# Patient Record
Sex: Female | Born: 1965
Health system: Southern US, Community
[De-identification: ages and names within clinical notes are randomized; demographics above are authoritative.]

## PROBLEM LIST (undated history)

## (undated) VITALS — BP 141/81 | HR 79 | Temp 98.9°F | Resp 18

## (undated) DIAGNOSIS — F319 Bipolar disorder, unspecified: Secondary | ICD-10-CM

## (undated) DIAGNOSIS — F259 Schizoaffective disorder, unspecified: Secondary | ICD-10-CM

## (undated) DIAGNOSIS — E079 Disorder of thyroid, unspecified: Secondary | ICD-10-CM

## (undated) HISTORY — PX: CHOLECYSTECTOMY: SHX55

---

## 2019-03-17 ENCOUNTER — Emergency Department (HOSPITAL_COMMUNITY): Payer: Medicare Other

## 2019-03-17 ENCOUNTER — Encounter (HOSPITAL_COMMUNITY): Payer: Self-pay | Admitting: Emergency Medicine

## 2019-03-17 ENCOUNTER — Other Ambulatory Visit: Payer: Self-pay

## 2019-03-17 ENCOUNTER — Inpatient Hospital Stay (HOSPITAL_COMMUNITY)
Admission: EM | Admit: 2019-03-17 | Discharge: 2019-03-22 | DRG: 392 | Disposition: A | Discharge status: Nursing Home (Includes ICF) | Payer: Medicare Other | Source: Skilled Nursing Facility | Attending: Internal Medicine | Admitting: Internal Medicine

## 2019-03-17 ENCOUNTER — Ambulatory Visit (HOSPITAL_COMMUNITY)
Admission: EM | Admit: 2019-03-17 | Discharge: 2019-03-17 | Disposition: A | Discharge status: Home / Self Care | Payer: No Typology Code available for payment source | Source: Ambulatory Visit | Attending: Emergency Medicine | Admitting: Emergency Medicine

## 2019-03-17 DIAGNOSIS — R519 Headache, unspecified: Secondary | ICD-10-CM

## 2019-03-17 DIAGNOSIS — F259 Schizoaffective disorder, unspecified: Secondary | ICD-10-CM | POA: Diagnosis present

## 2019-03-17 DIAGNOSIS — F17210 Nicotine dependence, cigarettes, uncomplicated: Secondary | ICD-10-CM | POA: Diagnosis present

## 2019-03-17 DIAGNOSIS — Z720 Tobacco use: Secondary | ICD-10-CM

## 2019-03-17 DIAGNOSIS — A084 Viral intestinal infection, unspecified: Secondary | ICD-10-CM | POA: Diagnosis not present

## 2019-03-17 DIAGNOSIS — D3502 Benign neoplasm of left adrenal gland: Secondary | ICD-10-CM | POA: Diagnosis present

## 2019-03-17 DIAGNOSIS — Z23 Encounter for immunization: Secondary | ICD-10-CM

## 2019-03-17 DIAGNOSIS — Z590 Homelessness: Secondary | ICD-10-CM

## 2019-03-17 DIAGNOSIS — Z20828 Contact with and (suspected) exposure to other viral communicable diseases: Secondary | ICD-10-CM | POA: Diagnosis present

## 2019-03-17 DIAGNOSIS — R112 Nausea with vomiting, unspecified: Secondary | ICD-10-CM

## 2019-03-17 DIAGNOSIS — Z0441 Encounter for examination and observation following alleged adult rape: Secondary | ICD-10-CM | POA: Diagnosis present

## 2019-03-17 DIAGNOSIS — T7421XA Adult sexual abuse, confirmed, initial encounter: Secondary | ICD-10-CM

## 2019-03-17 DIAGNOSIS — Z9049 Acquired absence of other specified parts of digestive tract: Secondary | ICD-10-CM

## 2019-03-17 DIAGNOSIS — R42 Dizziness and giddiness: Secondary | ICD-10-CM

## 2019-03-17 HISTORY — DX: Schizoaffective disorder, unspecified: F25.9

## 2019-03-17 LAB — COMPREHENSIVE METABOLIC PANEL
ALT: 16 U/L (ref 0–44)
AST: 24 U/L (ref 15–41)
Albumin: 4.4 g/dL (ref 3.5–5.0)
Alkaline Phosphatase: 111 U/L (ref 38–126)
Anion gap: 14 (ref 5–15)
BUN: 12 mg/dL (ref 6–20)
CO2: 24 mmol/L (ref 22–32)
Calcium: 9.5 mg/dL (ref 8.9–10.3)
Chloride: 102 mmol/L (ref 98–111)
Creatinine, Ser: 0.63 mg/dL (ref 0.44–1.00)
GFR calc Af Amer: >60 mL/min (ref >60)
GFR calc non Af Amer: >60 mL/min (ref >60)
Glucose, Bld: 110 mg/dL — ABNORMAL HIGH (ref 70–99)
Potassium: 4.4 mmol/L (ref 3.5–5.1)
Sodium: 140 mmol/L (ref 135–145)
Total Bilirubin: 1.1 mg/dL (ref 0.3–1.2)
Total Protein: 7.9 g/dL (ref 6.5–8.1)

## 2019-03-17 LAB — CBC WITH DIFFERENTIAL/PLATELET
Abs Immature Granulocytes: 0.03 10*3/uL (ref 0.00–0.07)
Basophils Absolute: 0.1 10*3/uL (ref 0.0–0.1)
Basophils Relative: 1 %
Eosinophils Absolute: 0.1 10*3/uL (ref 0.0–0.5)
Eosinophils Relative: 1 %
HCT: 50.3 % — ABNORMAL HIGH (ref 36.0–46.0)
Hemoglobin: 16.2 g/dL — ABNORMAL HIGH (ref 12.0–15.0)
Immature Granulocytes: 0 %
Lymphocytes Relative: 18 %
Lymphs Abs: 2.2 10*3/uL (ref 0.7–4.0)
MCH: 28.3 pg (ref 26.0–34.0)
MCHC: 32.2 g/dL (ref 30.0–36.0)
MCV: 87.8 fL (ref 80.0–100.0)
Monocytes Absolute: 0.6 10*3/uL (ref 0.1–1.0)
Monocytes Relative: 5 %
Neutro Abs: 9 10*3/uL — ABNORMAL HIGH (ref 1.7–7.7)
Neutrophils Relative %: 75 %
Platelets: 232 10*3/uL (ref 150–400)
RBC: 5.73 MIL/uL — ABNORMAL HIGH (ref 3.87–5.11)
RDW: 12.7 % (ref 11.5–15.5)
WBC: 12 10*3/uL — ABNORMAL HIGH (ref 4.0–10.5)
nRBC: 0 % (ref 0.0–0.2)

## 2019-03-17 LAB — I-STAT BETA HCG BLOOD, ED (MC, WL, AP ONLY): I-stat hCG, quantitative: <5 m[IU]/mL (ref <5)

## 2019-03-17 LAB — LIPASE, BLOOD: Lipase: 25 U/L (ref 11–51)

## 2019-03-17 MED ORDER — ONDANSETRON HCL 4 MG/2ML IJ SOLN
4.0000 mg | Freq: Once | INTRAMUSCULAR | Status: AC
  Administered 2019-03-17: 4 mg via INTRAVENOUS
  Filled 2019-03-17: qty 2

## 2019-03-17 MED ORDER — CEFTRIAXONE SODIUM 250 MG IJ SOLR
250.0000 mg | Freq: Once | INTRAMUSCULAR | Status: AC
  Administered 2019-03-17: 250 mg via INTRAMUSCULAR
  Filled 2019-03-17: qty 250

## 2019-03-17 MED ORDER — LIDOCAINE HCL (PF) 1 % IJ SOLN
0.9000 mL | Freq: Once | INTRAMUSCULAR | Status: AC
  Administered 2019-03-17: 0.9 mL
  Filled 2019-03-17: qty 30

## 2019-03-17 MED ORDER — NICOTINE 14 MG/24HR TD PT24
14.0000 mg | MEDICATED_PATCH | Freq: Every day | TRANSDERMAL | Status: DC
  Administered 2019-03-18 – 2019-03-22 (×5): 14 mg via TRANSDERMAL
  Filled 2019-03-17 (×5): qty 1

## 2019-03-17 MED ORDER — ACETAMINOPHEN 325 MG PO TABS
650.0000 mg | ORAL_TABLET | Freq: Once | ORAL | Status: AC
  Administered 2019-03-17: 650 mg via ORAL
  Filled 2019-03-17: qty 2

## 2019-03-17 MED ORDER — ENOXAPARIN SODIUM 40 MG/0.4ML ~~LOC~~ SOLN
40.0000 mg | Freq: Every day | SUBCUTANEOUS | Status: DC
  Administered 2019-03-18 – 2019-03-22 (×5): 40 mg via SUBCUTANEOUS
  Filled 2019-03-17 (×5): qty 0.4

## 2019-03-17 MED ORDER — IOHEXOL 300 MG/ML  SOLN
100.0000 mL | Freq: Once | INTRAMUSCULAR | Status: AC | PRN
  Administered 2019-03-17: 100 mL via INTRAVENOUS

## 2019-03-17 MED ORDER — SODIUM CHLORIDE 0.9 % IV BOLUS
1000.0000 mL | Freq: Once | INTRAVENOUS | Status: AC
  Administered 2019-03-17: 1000 mL via INTRAVENOUS

## 2019-03-17 MED ORDER — STERILE WATER FOR INJECTION IJ SOLN
INTRAMUSCULAR | Status: AC
  Filled 2019-03-17: qty 10

## 2019-03-17 MED ORDER — ACETAMINOPHEN 650 MG RE SUPP: 650.0000 mg | Freq: Four times a day (QID) | RECTAL | Status: DC | PRN

## 2019-03-17 MED ORDER — METOCLOPRAMIDE HCL 5 MG/ML IJ SOLN
10.0000 mg | Freq: Once | INTRAMUSCULAR | Status: AC
  Administered 2019-03-17: 10 mg via INTRAVENOUS
  Filled 2019-03-17: qty 2

## 2019-03-17 MED ORDER — IOHEXOL 350 MG/ML SOLN
80.0000 mL | Freq: Once | INTRAVENOUS | Status: AC | PRN
  Administered 2019-03-17: 80 mL via INTRAVENOUS

## 2019-03-17 MED ORDER — METRONIDAZOLE 500 MG PO TABS
2000.0000 mg | ORAL_TABLET | Freq: Once | ORAL | Status: AC
  Administered 2019-03-17: 2000 mg via ORAL
  Filled 2019-03-17: qty 4

## 2019-03-17 MED ORDER — SODIUM CHLORIDE (PF) 0.9 % IJ SOLN
INTRAMUSCULAR | Status: AC
  Administered 2019-03-17: 10 mL
  Filled 2019-03-17: qty 50

## 2019-03-17 MED ORDER — SODIUM CHLORIDE 0.9 % IV SOLN
INTRAVENOUS | Status: AC
  Administered 2019-03-18 (×2): via INTRAVENOUS

## 2019-03-17 MED ORDER — AZITHROMYCIN 250 MG PO TABS
1000.0000 mg | ORAL_TABLET | Freq: Once | ORAL | Status: AC
  Administered 2019-03-17: 1000 mg via ORAL
  Filled 2019-03-17: qty 4

## 2019-03-17 MED ORDER — ONDANSETRON HCL 4 MG/2ML IJ SOLN
4.0000 mg | Freq: Four times a day (QID) | INTRAMUSCULAR | Status: DC | PRN
  Administered 2019-03-18 (×2): 4 mg via INTRAVENOUS
  Filled 2019-03-17 (×2): qty 2

## 2019-03-17 MED ORDER — ACETAMINOPHEN 325 MG PO TABS
650.0000 mg | ORAL_TABLET | Freq: Four times a day (QID) | ORAL | Status: DC | PRN
  Administered 2019-03-18 – 2019-03-22 (×2): 650 mg via ORAL
  Filled 2019-03-17 (×2): qty 2
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
## 2019-03-17 NOTE — ED Notes (Signed)
Sane Nurse Called in.
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
## 2019-03-18 ENCOUNTER — Encounter (HOSPITAL_COMMUNITY): Payer: Self-pay | Admitting: Internal Medicine

## 2019-03-18 DIAGNOSIS — A084 Viral intestinal infection, unspecified: Secondary | ICD-10-CM | POA: Diagnosis present

## 2019-03-18 DIAGNOSIS — Z720 Tobacco use: Secondary | ICD-10-CM

## 2019-03-18 DIAGNOSIS — R519 Headache, unspecified: Secondary | ICD-10-CM

## 2019-03-18 DIAGNOSIS — D3502 Benign neoplasm of left adrenal gland: Secondary | ICD-10-CM | POA: Diagnosis present

## 2019-03-18 DIAGNOSIS — Z20828 Contact with and (suspected) exposure to other viral communicable diseases: Secondary | ICD-10-CM | POA: Diagnosis present

## 2019-03-18 DIAGNOSIS — R112 Nausea with vomiting, unspecified: Secondary | ICD-10-CM | POA: Diagnosis present

## 2019-03-18 DIAGNOSIS — R42 Dizziness and giddiness: Secondary | ICD-10-CM

## 2019-03-18 DIAGNOSIS — F17210 Nicotine dependence, cigarettes, uncomplicated: Secondary | ICD-10-CM | POA: Diagnosis present

## 2019-03-18 DIAGNOSIS — Z23 Encounter for immunization: Secondary | ICD-10-CM | POA: Diagnosis present

## 2019-03-18 DIAGNOSIS — Z9049 Acquired absence of other specified parts of digestive tract: Secondary | ICD-10-CM | POA: Diagnosis not present

## 2019-03-18 DIAGNOSIS — Z590 Homelessness: Secondary | ICD-10-CM | POA: Diagnosis not present

## 2019-03-18 DIAGNOSIS — T7421XA Adult sexual abuse, confirmed, initial encounter: Secondary | ICD-10-CM

## 2019-03-18 DIAGNOSIS — F259 Schizoaffective disorder, unspecified: Secondary | ICD-10-CM | POA: Diagnosis present

## 2019-03-18 LAB — WET PREP, GENITAL
Clue Cells Wet Prep HPF POC: NONE SEEN
Sperm: NONE SEEN
Trich, Wet Prep: NONE SEEN
Yeast Wet Prep HPF POC: NONE SEEN

## 2019-03-18 LAB — CBC
HCT: 43.7 % (ref 36.0–46.0)
Hemoglobin: 13.8 g/dL (ref 12.0–15.0)
MCH: 28.7 pg (ref 26.0–34.0)
MCHC: 31.6 g/dL (ref 30.0–36.0)
MCV: 90.9 fL (ref 80.0–100.0)
Platelets: 205 10*3/uL (ref 150–400)
RBC: 4.81 MIL/uL (ref 3.87–5.11)
RDW: 12.8 % (ref 11.5–15.5)
WBC: 11.7 10*3/uL — ABNORMAL HIGH (ref 4.0–10.5)
nRBC: 0 % (ref 0.0–0.2)

## 2019-03-18 LAB — SARS CORONAVIRUS 2 (TAT 6-24 HRS): SARS Coronavirus 2: NEGATIVE

## 2019-03-18 LAB — URINALYSIS, ROUTINE W REFLEX MICROSCOPIC
Bilirubin Urine: NEGATIVE
Glucose, UA: NEGATIVE mg/dL
Hgb urine dipstick: NEGATIVE
Ketones, ur: 80 mg/dL — AB
Leukocytes,Ua: NEGATIVE
Nitrite: NEGATIVE
Protein, ur: NEGATIVE mg/dL
Specific Gravity, Urine: >1.046 — ABNORMAL HIGH (ref 1.005–1.030)
pH: 5 (ref 5.0–8.0)

## 2019-03-18 LAB — RAPID URINE DRUG SCREEN, HOSP PERFORMED
Amphetamines: NOT DETECTED
Barbiturates: NOT DETECTED
Benzodiazepines: NOT DETECTED
Cocaine: NOT DETECTED
Opiates: NOT DETECTED
Tetrahydrocannabinol: NOT DETECTED

## 2019-03-18 LAB — HIV ANTIBODY (ROUTINE TESTING W REFLEX): HIV Screen 4th Generation wRfx: NONREACTIVE

## 2019-03-18 NOTE — ED Notes (Signed)
Pt said she is dizzy and would like medication to stop dizziness. Messaged provider
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
## 2019-03-18 NOTE — ED Notes (Addendum)
Pt drank 32 oz. water
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
## 2019-03-19 MED ORDER — PNEUMOCOCCAL VAC POLYVALENT 25 MCG/0.5ML IJ INJ
0.5000 mL | INJECTION | INTRAMUSCULAR | Status: AC
  Administered 2019-03-20: 0.5 mL via INTRAMUSCULAR
  Filled 2019-03-19: qty 0.5

## 2019-03-19 NOTE — Progress Notes (Signed)
24hr urine collection started 03/19/2019 @ 0500. Norlene Duel RN, BSN

## 2019-03-19 NOTE — Progress Notes (Signed)
Patient took 24 hour urine off of the ice in bathroom and brought it to the desk counter and left it. Unsure of how long it was left off of ice. CNA and RN unable to accurately collect for 24 hour urine.  Donne Hazel, RN
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
## 2019-03-19 NOTE — Progress Notes (Signed)
Patient spits in her urine hat post void making collection of 24 hour urine difficult. She states "I have urine coming through my gums". Dr Marthenia Rolling paged to make him aware. Donne Hazel, RN

## 2019-03-20 ENCOUNTER — Other Ambulatory Visit: Payer: Self-pay

## 2019-03-20 LAB — DHEA-SULFATE: DHEA-SO4: 138 ug/dL (ref 41.2–243.7)

## 2019-03-20 MED ORDER — CLONAZEPAM 0.5 MG PO TABS
0.5000 mg | ORAL_TABLET | Freq: Two times a day (BID) | ORAL | Status: DC
  Administered 2019-03-20 – 2019-03-22 (×4): 0.5 mg via ORAL
  Filled 2019-03-20 (×5): qty 1

## 2019-03-20 MED ORDER — HALOPERIDOL 2 MG PO TABS
2.0000 mg | ORAL_TABLET | Freq: Two times a day (BID) | ORAL | Status: DC
  Administered 2019-03-20 – 2019-03-22 (×4): 2 mg via ORAL
  Filled 2019-03-20 (×6): qty 1

## 2019-03-20 MED ORDER — LAMOTRIGINE 100 MG PO TABS
100.0000 mg | ORAL_TABLET | Freq: Every day | ORAL | Status: DC
  Administered 2019-03-20 – 2019-03-22 (×3): 100 mg via ORAL
  Filled 2019-03-20 (×3): qty 1

## 2019-03-20 MED ORDER — HYDROXYZINE HCL 25 MG PO TABS: 25.0000 mg | ORAL_TABLET | Freq: Four times a day (QID) | ORAL | Status: DC | PRN
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
## 2019-03-21 LAB — SARS CORONAVIRUS 2 (TAT 6-24 HRS): SARS Coronavirus 2: NEGATIVE

## 2019-03-21 MED ORDER — SIMETHICONE 80 MG PO CHEW: 80.0000 mg | CHEWABLE_TABLET | Freq: Four times a day (QID) | ORAL | Status: DC | PRN

## 2019-03-21 MED ORDER — HALOPERIDOL 2 MG PO TABS: 2.0000 mg | ORAL_TABLET | Freq: Two times a day (BID) | ORAL | 0 refills | Status: DC

## 2019-03-21 MED ORDER — INFLUENZA VAC SPLIT QUAD 0.5 ML IM SUSY
0.5000 mL | PREFILLED_SYRINGE | INTRAMUSCULAR | Status: AC
  Administered 2019-03-22: 0.5 mL via INTRAMUSCULAR
  Filled 2019-03-21: qty 0.5

## 2019-03-21 MED ORDER — LAMOTRIGINE 100 MG PO TABS: 100.0000 mg | ORAL_TABLET | Freq: Every day | ORAL | 1 refills | Status: DC

## 2019-03-21 NOTE — TOC Progression Note (Signed)
Transition of Care [facility]) - Progression Note    Patient Details  Name: Martha Peters MRN: [id number] Date of Birth: July 07, 1965  Transition of Care [facility]) CM/SW Contact  Leeroy Cha, RN Phone Number: 03/21/2019, 12:15 PM  Clinical Narrative:    fl2 faxed to [facility]/will have to have new covid test-Dr. Karleen Hampshire made aware of this.   Expected Discharge Plan: Assisted Living Barriers to Discharge: Continued Medical Work up  Expected Discharge Plan and Services Expected Discharge Plan: Assisted Living   Discharge Planning Services: CM Consult Post Acute Care Choice: Resumption of Svcs/PTA Provider Living arrangements for the past 2 months: Assisted Living Facility                 DME Arranged: N/A DME Agency: NA       HH Arranged: NA HH Agency: NA         Social Determinants of Health (SDOH) Interventions    Readmission Risk Interventions No flowsheet data found.
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
## 2019-03-21 NOTE — Care Management Important Message (Signed)
Important Message  Patient Details IM Letter given to Velva Harman RN to present to the Patient Name: Martha Peters MRN: [id number] Date of Birth: May 12, 1966   Medicare Important Message Given:  Yes     Kerin Salen 03/21/2019, 12:38 PM
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
## 2019-03-22 NOTE — Progress Notes (Signed)
Patient is ready for discharge but unsure of when ALF is coming to pick her up. Also, patient needs a police escort to ALF. Call made to [place], [place] for assistance. Patient updated. Donne Hazel, RN

## 2019-03-22 NOTE — Progress Notes (Signed)
Call to Legal Guardian to make her aware that patient is retuning to ALF, [facility]. She verbalized understanding. Donne Hazel, RN

## 2019-03-22 NOTE — Progress Notes (Addendum)
Spoke with Roderic Palau to make him aware that patient needs police escort back to ALF per ED MD note. He will attempt to get one for her. Donne Hazel, RN
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
## 2019-05-26 ENCOUNTER — Emergency Department (HOSPITAL_COMMUNITY)
Admission: EM | Admit: 2019-05-26 | Discharge: 2019-05-26 | Disposition: A | Discharge status: Home / Self Care | Payer: Medicare Other | Attending: Emergency Medicine | Admitting: Emergency Medicine

## 2019-05-26 DIAGNOSIS — E876 Hypokalemia: Secondary | ICD-10-CM | POA: Insufficient documentation

## 2019-05-26 DIAGNOSIS — F17210 Nicotine dependence, cigarettes, uncomplicated: Secondary | ICD-10-CM | POA: Insufficient documentation

## 2019-05-26 DIAGNOSIS — Z79899 Other long term (current) drug therapy: Secondary | ICD-10-CM | POA: Insufficient documentation

## 2019-05-26 DIAGNOSIS — R451 Restlessness and agitation: Secondary | ICD-10-CM | POA: Diagnosis present

## 2019-05-26 LAB — COMPREHENSIVE METABOLIC PANEL
ALT: 17 U/L (ref 0–44)
AST: 24 U/L (ref 15–41)
Albumin: 4.9 g/dL (ref 3.5–5.0)
Alkaline Phosphatase: 99 U/L (ref 38–126)
Anion gap: 16 — ABNORMAL HIGH (ref 5–15)
BUN: 27 mg/dL — ABNORMAL HIGH (ref 6–20)
CO2: 27 mmol/L (ref 22–32)
Calcium: 9.9 mg/dL (ref 8.9–10.3)
Chloride: 96 mmol/L — ABNORMAL LOW (ref 98–111)
Creatinine, Ser: 0.67 mg/dL (ref 0.44–1.00)
GFR calc Af Amer: >60 mL/min (ref >60)
GFR calc non Af Amer: >60 mL/min (ref >60)
Glucose, Bld: 97 mg/dL (ref 70–99)
Potassium: 3.1 mmol/L — ABNORMAL LOW (ref 3.5–5.1)
Sodium: 139 mmol/L (ref 135–145)
Total Bilirubin: 1.5 mg/dL — ABNORMAL HIGH (ref 0.3–1.2)
Total Protein: 8.3 g/dL — ABNORMAL HIGH (ref 6.5–8.1)

## 2019-05-26 LAB — CBC
HCT: 48.6 % — ABNORMAL HIGH (ref 36.0–46.0)
Hemoglobin: 15.8 g/dL — ABNORMAL HIGH (ref 12.0–15.0)
MCH: 28.3 pg (ref 26.0–34.0)
MCHC: 32.5 g/dL (ref 30.0–36.0)
MCV: 86.9 fL (ref 80.0–100.0)
Platelets: 297 10*3/uL (ref 150–400)
RBC: 5.59 MIL/uL — ABNORMAL HIGH (ref 3.87–5.11)
RDW: 13.3 % (ref 11.5–15.5)
WBC: 13 10*3/uL — ABNORMAL HIGH (ref 4.0–10.5)
nRBC: 0 % (ref 0.0–0.2)

## 2019-05-26 LAB — RAPID URINE DRUG SCREEN, HOSP PERFORMED
Amphetamines: NOT DETECTED
Barbiturates: NOT DETECTED
Benzodiazepines: POSITIVE — AB
Cocaine: NOT DETECTED
Opiates: NOT DETECTED
Tetrahydrocannabinol: NOT DETECTED

## 2019-05-26 LAB — SALICYLATE LEVEL: Salicylate Lvl: <7 mg/dL — ABNORMAL LOW (ref 7.0–30.0)

## 2019-05-26 LAB — ACETAMINOPHEN LEVEL: Acetaminophen (Tylenol), Serum: <10 ug/mL — ABNORMAL LOW (ref 10–30)

## 2019-05-26 LAB — ETHANOL: Alcohol, Ethyl (B): <10 mg/dL (ref <10)

## 2019-05-26 MED ORDER — POTASSIUM CHLORIDE CRYS ER 20 MEQ PO TBCR
40.0000 meq | EXTENDED_RELEASE_TABLET | Freq: Once | ORAL | Status: AC
  Administered 2019-05-26: 14:00:00 40 meq via ORAL
  Filled 2019-05-26: qty 2

## 2019-05-26 MED ORDER — LORAZEPAM 1 MG PO TABS
1.0000 mg | ORAL_TABLET | Freq: Once | ORAL | Status: AC
  Administered 2019-05-26: 17:00:00 1 mg via ORAL
  Filled 2019-05-26: qty 1

## 2019-05-26 NOTE — ED Triage Notes (Signed)
Pt brought to [facility] by EMS. VS .BP 138/ 70 ,HR 106, O2 97% , T 96 .  Pt cooperative with EMS. Pt from facility, [place] [facility]. Pt was combative at facility. Pt resting in bed.

## 2019-05-26 NOTE — Progress Notes (Addendum)
CSW met with pt who viewed the following complaints about her facility:  1. Water is oily. 2.  Pt would like pepsi, facility is not offering that or tea to the pt. 3. The food at the facility, "is infected".  Pt presented as responding to external stimuli and presented with pressure speech, eventually launching into a long rambling, difficult to followdiscourse about a time when the facility gave her medications and ended it with, "my brains are falling out".  Pt's stated thoughts are presenting as disconnected at times.  Pt at no time stated she was in danger.  Pt stated she came to the hospital because her facility wanted her to,"get checked out" and stated that the pt is willing to go back if CSW contacts [facility].  CSW will continue to follow for D/C needs.  Alphonse Guild. Johnel Yielding, LCSW, LCAS, CSI Transitions of Care Clinical Social Worker Care Coordination Department Ph: [phone]

## 2019-05-26 NOTE — Progress Notes (Signed)
CSW received a call from staff at [facility] stating the patient has been offered a bed and has been accepted for return and that the pt can arrive on ASAP on 1/10.  The room number will [id number].  The number for report is [phone].  CSW will update RN and EDP.  Alphonse Guild. Crystal Scarberry, Latanya Presser, LCAS Clinical Social Worker Ph: [phone]

## 2019-05-26 NOTE — Progress Notes (Signed)
CSW called pt's facility at:  [place] [address]  CSW called the facility four times and called all the names in the directory but could not speak to anyone in person, nor could the CSW get the facility's on-call supervisor by dialing 0.  CSW called [employer] to request a welfare check to request the facility call the CSW back.  [employer] to call the facility  CSW will continue to follow for D/C needs.  Alphonse Guild. Shatina Streets, LCSW, LCAS, CSI Transitions of Care Clinical Social Worker Care Coordination Department Ph: [phone]

## 2019-05-26 NOTE — Progress Notes (Signed)
CSW received a call from [facility] at the facility that states that pt is safe to return by taxi.  CSW to provide.  CSW will continue to follow for D/C needs.  Martha Peters. Margerie Fraiser, LCSW, LCAS, CSI Transitions of Care Clinical Social Worker Care Coordination Department Ph: [phone]

## 2019-05-26 NOTE — Progress Notes (Signed)
Per [employer], pt is unhappy with the pt's group home and would like to speak to social work.  CSW will continue to follow for D/C needs.  Alphonse Guild. Vergil Burby, LCSW, LCAS, CSI Transitions of Care Clinical Social Worker Care Coordination Department Ph: [phone]

## 2019-05-26 NOTE — Progress Notes (Signed)
05/26/2019  1114  Labs drawn. Dark green, light green, gold, red, and purple tubes sent to main lab. Urine sent to main lab.
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
## 2019-05-26 NOTE — ED Notes (Signed)
DCd off unit to [employer] per provider. Pt cooperative, no s/s of distress. Pt transported by [facility]

## 2019-06-17 ENCOUNTER — Other Ambulatory Visit: Payer: Self-pay

## 2019-06-17 ENCOUNTER — Encounter (HOSPITAL_COMMUNITY): Payer: Self-pay | Admitting: Registered Nurse

## 2019-06-17 ENCOUNTER — Encounter (HOSPITAL_COMMUNITY): Payer: Self-pay | Admitting: Psychiatry

## 2019-06-17 ENCOUNTER — Inpatient Hospital Stay (HOSPITAL_COMMUNITY)
Admission: RE | Admit: 2019-06-17 | Discharge: 2019-06-25 | DRG: 885 | Disposition: A | Discharge status: Home / Self Care | Payer: Medicare HMO | Attending: Psychiatry | Admitting: Psychiatry

## 2019-06-17 ENCOUNTER — Observation Stay (HOSPITAL_COMMUNITY)
Admission: RE | Admit: 2019-06-17 | Discharge: 2019-06-17 | Disposition: A | Discharge status: Rehabilitation Facility | Payer: Medicare HMO | Attending: Emergency Medicine | Admitting: Emergency Medicine

## 2019-06-17 DIAGNOSIS — R569 Unspecified convulsions: Secondary | ICD-10-CM | POA: Diagnosis present

## 2019-06-17 DIAGNOSIS — E039 Hypothyroidism, unspecified: Secondary | ICD-10-CM | POA: Diagnosis present

## 2019-06-17 DIAGNOSIS — Z91128 Patient's intentional underdosing of medication regimen for other reason: Secondary | ICD-10-CM | POA: Insufficient documentation

## 2019-06-17 DIAGNOSIS — Z79899 Other long term (current) drug therapy: Secondary | ICD-10-CM

## 2019-06-17 DIAGNOSIS — F250 Schizoaffective disorder, bipolar type: Secondary | ICD-10-CM | POA: Diagnosis present

## 2019-06-17 DIAGNOSIS — Z886 Allergy status to analgesic agent status: Secondary | ICD-10-CM | POA: Insufficient documentation

## 2019-06-17 DIAGNOSIS — Z91018 Allergy to other foods: Secondary | ICD-10-CM

## 2019-06-17 DIAGNOSIS — Z008 Encounter for other general examination: Principal | ICD-10-CM | POA: Insufficient documentation

## 2019-06-17 DIAGNOSIS — Z20822 Contact with and (suspected) exposure to covid-19: Secondary | ICD-10-CM | POA: Insufficient documentation

## 2019-06-17 DIAGNOSIS — D529 Folate deficiency anemia, unspecified: Secondary | ICD-10-CM | POA: Diagnosis present

## 2019-06-17 DIAGNOSIS — F17210 Nicotine dependence, cigarettes, uncomplicated: Secondary | ICD-10-CM | POA: Insufficient documentation

## 2019-06-17 DIAGNOSIS — Z03818 Encounter for observation for suspected exposure to other biological agents ruled out: Secondary | ICD-10-CM | POA: Diagnosis not present

## 2019-06-17 DIAGNOSIS — Z9114 Patient's other noncompliance with medication regimen: Secondary | ICD-10-CM

## 2019-06-17 DIAGNOSIS — E559 Vitamin D deficiency, unspecified: Secondary | ICD-10-CM | POA: Diagnosis present

## 2019-06-17 DIAGNOSIS — G4700 Insomnia, unspecified: Secondary | ICD-10-CM | POA: Diagnosis present

## 2019-06-17 DIAGNOSIS — R69 Illness, unspecified: Secondary | ICD-10-CM | POA: Diagnosis not present

## 2019-06-17 DIAGNOSIS — Z91410 Personal history of adult physical and sexual abuse: Secondary | ICD-10-CM | POA: Diagnosis not present

## 2019-06-17 DIAGNOSIS — F4010 Social phobia, unspecified: Secondary | ICD-10-CM | POA: Diagnosis present

## 2019-06-17 DIAGNOSIS — F332 Major depressive disorder, recurrent severe without psychotic features: Secondary | ICD-10-CM | POA: Insufficient documentation

## 2019-06-17 DIAGNOSIS — G3184 Mild cognitive impairment, so stated: Secondary | ICD-10-CM | POA: Diagnosis present

## 2019-06-17 DIAGNOSIS — Z9049 Acquired absence of other specified parts of digestive tract: Secondary | ICD-10-CM

## 2019-06-17 DIAGNOSIS — Z833 Family history of diabetes mellitus: Secondary | ICD-10-CM

## 2019-06-17 DIAGNOSIS — Z888 Allergy status to other drugs, medicaments and biological substances status: Secondary | ICD-10-CM

## 2019-06-17 DIAGNOSIS — Z818 Family history of other mental and behavioral disorders: Secondary | ICD-10-CM

## 2019-06-17 LAB — CBC
HCT: 44.5 % (ref 36.0–46.0)
Hemoglobin: 14.4 g/dL (ref 12.0–15.0)
MCH: 28.8 pg (ref 26.0–34.0)
MCHC: 32.4 g/dL (ref 30.0–36.0)
MCV: 89 fL (ref 80.0–100.0)
Platelets: 205 10*3/uL (ref 150–400)
RBC: 5 MIL/uL (ref 3.87–5.11)
RDW: 13.2 % (ref 11.5–15.5)
WBC: 7.3 10*3/uL (ref 4.0–10.5)
nRBC: 0 % (ref 0.0–0.2)

## 2019-06-17 LAB — COMPREHENSIVE METABOLIC PANEL
ALT: 12 U/L (ref 0–44)
AST: 15 U/L (ref 15–41)
Albumin: 3.7 g/dL (ref 3.5–5.0)
Alkaline Phosphatase: 83 U/L (ref 38–126)
Anion gap: 8 (ref 5–15)
BUN: 13 mg/dL (ref 6–20)
CO2: 26 mmol/L (ref 22–32)
Calcium: 9.3 mg/dL (ref 8.9–10.3)
Chloride: 107 mmol/L (ref 98–111)
Creatinine, Ser: 0.49 mg/dL (ref 0.44–1.00)
GFR calc Af Amer: >60 mL/min (ref >60)
GFR calc non Af Amer: >60 mL/min (ref >60)
Glucose, Bld: 133 mg/dL — ABNORMAL HIGH (ref 70–99)
Potassium: 3.9 mmol/L (ref 3.5–5.1)
Sodium: 141 mmol/L (ref 135–145)
Total Bilirubin: 0.4 mg/dL (ref 0.3–1.2)
Total Protein: 6.8 g/dL (ref 6.5–8.1)

## 2019-06-17 LAB — RAPID URINE DRUG SCREEN, HOSP PERFORMED
Amphetamines: NOT DETECTED
Barbiturates: NOT DETECTED
Benzodiazepines: NOT DETECTED
Cocaine: NOT DETECTED
Opiates: NOT DETECTED
Tetrahydrocannabinol: NOT DETECTED

## 2019-06-17 LAB — RESPIRATORY PANEL BY RT PCR (FLU A&B, COVID)
Influenza A by PCR: NEGATIVE
Influenza B by PCR: NEGATIVE
SARS Coronavirus 2 by RT PCR: NEGATIVE

## 2019-06-17 LAB — ETHANOL: Alcohol, Ethyl (B): <10 mg/dL (ref <10)

## 2019-06-17 LAB — SALICYLATE LEVEL: Salicylate Lvl: <7 mg/dL — ABNORMAL LOW (ref 7.0–30.0)

## 2019-06-17 LAB — ACETAMINOPHEN LEVEL: Acetaminophen (Tylenol), Serum: <10 ug/mL — ABNORMAL LOW (ref 10–30)

## 2019-06-17 MED ORDER — CHOLECALCIFEROL 10 MCG (400 UNIT) PO TABS
400.0000 [IU] | ORAL_TABLET | Freq: Every day | ORAL | Status: DC
  Filled 2019-06-17 (×2): qty 1

## 2019-06-17 MED ORDER — MAGNESIUM HYDROXIDE 400 MG/5ML PO SUSP: 30.0000 mL | Freq: Every day | ORAL | Status: DC | PRN

## 2019-06-17 MED ORDER — VENLAFAXINE HCL ER 150 MG PO CP24
150.0000 mg | ORAL_CAPSULE | Freq: Every day | ORAL | Status: DC
  Administered 2019-06-19: 150 mg via ORAL
  Filled 2019-06-17 (×3): qty 1

## 2019-06-17 MED ORDER — FOLIC ACID 1 MG PO TABS
1.0000 mg | ORAL_TABLET | Freq: Every day | ORAL | Status: DC
  Administered 2019-06-19 – 2019-06-25 (×5): 1 mg via ORAL
  Filled 2019-06-17 (×9): qty 1

## 2019-06-17 MED ORDER — ALUM & MAG HYDROXIDE-SIMETH 200-200-20 MG/5ML PO SUSP
30.0000 mL | ORAL | Status: DC | PRN
  Administered 2019-06-20 – 2019-06-21 (×3): 30 mL via ORAL

## 2019-06-17 MED ORDER — CLONAZEPAM 0.5 MG PO TABS
0.5000 mg | ORAL_TABLET | Freq: Every day | ORAL | Status: DC
  Administered 2019-06-19 – 2019-06-20 (×2): 0.5 mg via ORAL
  Filled 2019-06-17 (×3): qty 1

## 2019-06-17 MED ORDER — ACETAMINOPHEN 325 MG PO TABS
650.0000 mg | ORAL_TABLET | Freq: Four times a day (QID) | ORAL | Status: DC | PRN
  Administered 2019-06-18 – 2019-06-24 (×4): 650 mg via ORAL
  Filled 2019-06-17 (×4): qty 2

## 2019-06-17 MED ORDER — VITAMIN B-12 100 MCG PO TABS
100.0000 ug | ORAL_TABLET | Freq: Every day | ORAL | Status: DC
  Administered 2019-06-19 – 2019-06-25 (×5): 100 ug via ORAL
  Filled 2019-06-17 (×13): qty 1

## 2019-06-17 MED ORDER — LIOTHYRONINE SODIUM 25 MCG PO TABS
25.0000 ug | ORAL_TABLET | Freq: Every day | ORAL | Status: DC
  Administered 2019-06-19 – 2019-06-25 (×5): 25 ug via ORAL
  Filled 2019-06-17 (×10): qty 1

## 2019-06-17 MED ORDER — CLONAZEPAM 1 MG PO TABS
1.0000 mg | ORAL_TABLET | Freq: Every day | ORAL | Status: DC
  Administered 2019-06-18 – 2019-06-20 (×3): 1 mg via ORAL
  Filled 2019-06-17 (×3): qty 1

## 2019-06-17 MED ORDER — LEVETIRACETAM 500 MG PO TABS
500.0000 mg | ORAL_TABLET | Freq: Two times a day (BID) | ORAL | Status: DC
  Administered 2019-06-19 – 2019-06-25 (×8): 500 mg via ORAL
  Filled 2019-06-17 (×19): qty 1

## 2019-06-17 NOTE — ED Triage Notes (Signed)
Patient arrived from [facility] for medical clearance.

## 2019-06-17 NOTE — H&P (Addendum)
Please see Observation Unit H&P completed by Earleen Newport, NP on 06/17/2019.
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
## 2019-06-17 NOTE — BH Assessment (Signed)
[facility] Assessment Progress Note  Case was staffed with Rankin NP who recommended patient be observed and monitored. Patient will be transported to [facility] for medical clearance. Charge nurse notified.
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
## 2019-06-17 NOTE — ED Notes (Addendum)
Report given to Maudie Mercury at [facility] and advised to wait for UDS to result then can be transferred to [facility] room 400-1.

## 2019-06-17 NOTE — H&P (Signed)
[place] [facility] Provider Admission PAA/H&P  Patient Identification: Martha Peters MRN:  [id number] Date of Evaluation:  06/17/2019 Chief Complaint:  mdd recurrent severe  Principal Diagnosis: Schizoaffective disorder, bipolar type ([place]) Diagnosis:  Principal Problem:   Schizoaffective disorder, bipolar type ([place])  History of Present Illness: Martha Peters, 54 y.o., female patient presented to [facility] as a walk in under IVC by staff member at assisted living facility.  IVC:  Patient not taking medications, agitation, aggressive behavior towards staff and other patient, not performing ADL'S.  seen face to face psych by this provider, consulted with Dr. Dwyane Dee; and chart reviewed on 06/17/19.  On evaluation Martha Peters reports that she doesn't know why she was brought to the hospital. Patient states that she has not been violent.  "I was only trying to get something to eat."  Patient denies suicidal/self-harm/homicidal ideation, psychosis, and paranoia.  Patient states that she does have a psychiatric history psychiatric hospitalization "A long time ago.  They diagnosed me with bipolar disorder."  Patient states that she is depressed and is not currently taking any medications for depression.   During evaluation Martha Peters is alert/oriented x 3; calm/cooperative; and mood depressed, with flat affect.  Patient does not appear to be responding to internal/external stimuli or delusional thoughts, but appears to be reluctant to answer some questions; may be related to thought blocking or patient not wanting to respond.  Patient denies suicidal/self-harm/homicidal ideation, psychosis, and paranoia.    Associated Signs/Symptoms: Depression Symptoms:  depressed mood, impaired memory, loss of energy/fatigue, (Hypo) Manic Symptoms:  Irritable Mood, Anxiety Symptoms:  Excessive Worry, Psychotic Symptoms:  denies PTSD Symptoms: Patient did not answer the question Total Time spent with patient:  30 minutes  Past Psychiatric History: Schizoaffective disorder bipolar type  Is the patient at risk to self? No.  Has the patient been a risk to self in the past 6 months? No.  Has the patient been a risk to self within the distant past? No.  Is the patient a risk to others? No.  Has the patient been a risk to others in the past 6 months? No.  Has the patient been a risk to others within the distant past? No.   Prior Inpatient Therapy: Prior Inpatient Therapy: No Prior Outpatient Therapy: Prior Outpatient Therapy: No Does patient have an ACCT team?: No Does patient have Intensive In-House Services?  : No Does patient have [facility] services? : No Does patient have P4CC services?: No  Alcohol Screening:   Substance Abuse History in the last 12 months:  Patient did not answer the question Consequences of Substance Abuse: NA Previous Psychotropic Medications: Yes  Psychological Evaluations: Yes  Past Medical History:  Past Medical History:  Diagnosis Date  . Schizoaffective disorder [facility])     Past Surgical History:  Procedure Laterality Date  . CHOLECYSTECTOMY     Family History:  Family History  Problem Relation Age of Onset  . Diabetes Maternal Grandmother    Family Psychiatric History: Unaware Tobacco Screening:   Social History:  Social History   Substance and Sexual Activity  Alcohol Use Never     Social History   Substance and Sexual Activity  Drug Use Never    Additional Social History: Marital status: Divorced    Pain Medications: See MAR Prescriptions: See MAR Over the Counter: See MAR History of alcohol / drug use?: No history of alcohol / drug abuse  Allergies:   Allergies  Allergen Reactions  . Celebrex [Celecoxib]   . Cinnamon    Lab Results: No results found for this or any previous visit (from the past 48 hour(s)).  Blood Alcohol level:  Lab Results  Component Value Date   ETH <10 0000000    Metabolic  Disorder Labs:  No results found for: HGBA1C, MPG No results found for: PROLACTIN No results found for: CHOL, TRIG, HDL, CHOLHDL, VLDL, LDLCALC  Current Medications: No current facility-administered medications for this encounter.   PTA Medications: Medications Prior to Admission  Medication Sig Dispense Refill Last Dose  . clonazePAM (KLONOPIN) 0.5 MG tablet Take 0.5 mg by mouth 2 (two) times daily.     . Cyanocobalamin (B-12) 100 MCG TABS Take 100 mcg by mouth daily.     . folic acid (FOLVITE) 1 MG tablet Take 1 mg by mouth daily.     . haloperidol (HALDOL) 2 MG tablet Take 1 tablet (2 mg total) by mouth 2 (two) times daily. 60 tablet 0   . hydrOXYzine (VISTARIL) 25 MG capsule Take 25 mg by mouth every 6 (six) hours as needed for anxiety.      . lamoTRIgine (LAMICTAL) 100 MG tablet Take 1 tablet (100 mg total) by mouth daily. 30 tablet 1   . levETIRAcetam (KEPPRA) 500 MG tablet Take 500 mg by mouth 2 (two) times daily.     Marland Kitchen liothyronine (CYTOMEL) 25 MCG tablet Take 25 mcg by mouth daily.     . Vitamin D, Cholecalciferol, 10 MCG (400 UNIT) TABS Take 400 Units by mouth daily.       Musculoskeletal: Strength & Muscle Tone: within normal limits Gait & Station: normal Patient leans: N/A  Psychiatric Specialty Exam: Physical Exam  Nursing note and vitals reviewed. Constitutional: She is oriented to person, place, and time. She appears well-developed and well-nourished.  Respiratory: Effort normal.  Musculoskeletal:        General: Normal range of motion.     Cervical back: Normal range of motion.  Neurological: She is alert and oriented to person, place, and time.  Skin: Skin is warm and dry.    Review of Systems  Psychiatric/Behavioral: Positive for agitation and behavioral problems. Hallucinations: Denies. Sleep disturbance: Denies. Suicidal ideas: Denies. Nervous/anxious: Denies.        Patient sent from assisted living related to increased agitation, violence towards staff  and other patients, history of schizoaffective disorder bipolar type and has not been taking her medications.   All other systems reviewed and are negative.   Blood pressure 114/73, pulse (!) 131, temperature 98.7 F (37.1 C), temperature source Oral, resp. rate 18, SpO2 98 %.There is no height or weight on file to calculate BMI.  General Appearance: Casual  Eye Contact:  Fair  Speech:  Clear and Coherent and Slow  Volume:  Decreased  Mood:  Depressed  Affect:  Depressed and Flat  Thought Process:  Coherent, Goal Directed, Linear and Descriptions of Associations: Intact  Orientation:  Full (Time, Place, and Person)  Thought Content:  WDL  Suicidal Thoughts:  No  Homicidal Thoughts:  No  Memory:  Immediate;   Fair Recent;   Fair  Judgement:  Fair  Insight:  Fair  Psychomotor Activity:  Decreased  Concentration:  Concentration: Fair and Attention Span: Fair  Recall:  [employer] of Knowledge:  Fair  Language:  Fair  Akathisia:  No  Handed:  Right  AIMS (if indicated):     Assets:  Desire for Improvement Housing Social Support  ADL's:  Intact  Cognition:  Impaired,  Mild  Sleep:       Treatment Plan Summary: Medication management and Plan Restart home medications  Observation Level/Precautions:  15 minute checks Laboratory:  CBC Chemistry Profile UDS UA  Patient sent to [facility] for medical clearance. Psychotherapy:  Individual Medications:  Restart home medications Consultations:  As needed Discharge Concerns:  Safety, stabilization, and access to medication Estimated LOS:  Overnight observation Other:     Patient has been accepted to [facility] OBS unit room 400/01 for overnight observation after medical clearance.      Deasia Chiu, NP 2/1/20216:26 PM

## 2019-06-17 NOTE — ED Notes (Signed)
[employer] here for transport to [facility].

## 2019-06-18 ENCOUNTER — Encounter (HOSPITAL_COMMUNITY): Payer: Self-pay | Admitting: Registered Nurse

## 2019-06-18 DIAGNOSIS — Z818 Family history of other mental and behavioral disorders: Secondary | ICD-10-CM | POA: Diagnosis not present

## 2019-06-18 DIAGNOSIS — Z20822 Contact with and (suspected) exposure to covid-19: Secondary | ICD-10-CM | POA: Diagnosis not present

## 2019-06-18 DIAGNOSIS — Z79899 Other long term (current) drug therapy: Secondary | ICD-10-CM | POA: Diagnosis not present

## 2019-06-18 DIAGNOSIS — Z833 Family history of diabetes mellitus: Secondary | ICD-10-CM | POA: Diagnosis not present

## 2019-06-18 DIAGNOSIS — D529 Folate deficiency anemia, unspecified: Secondary | ICD-10-CM | POA: Diagnosis present

## 2019-06-18 DIAGNOSIS — F4010 Social phobia, unspecified: Secondary | ICD-10-CM | POA: Diagnosis present

## 2019-06-18 DIAGNOSIS — G4700 Insomnia, unspecified: Secondary | ICD-10-CM | POA: Diagnosis present

## 2019-06-18 DIAGNOSIS — R569 Unspecified convulsions: Secondary | ICD-10-CM | POA: Diagnosis present

## 2019-06-18 DIAGNOSIS — Z91018 Allergy to other foods: Secondary | ICD-10-CM | POA: Diagnosis not present

## 2019-06-18 DIAGNOSIS — F250 Schizoaffective disorder, bipolar type: Secondary | ICD-10-CM | POA: Diagnosis not present

## 2019-06-18 DIAGNOSIS — G3184 Mild cognitive impairment, so stated: Secondary | ICD-10-CM | POA: Diagnosis not present

## 2019-06-18 DIAGNOSIS — E559 Vitamin D deficiency, unspecified: Secondary | ICD-10-CM | POA: Diagnosis present

## 2019-06-18 DIAGNOSIS — Z9049 Acquired absence of other specified parts of digestive tract: Secondary | ICD-10-CM | POA: Diagnosis not present

## 2019-06-18 DIAGNOSIS — E039 Hypothyroidism, unspecified: Secondary | ICD-10-CM | POA: Diagnosis present

## 2019-06-18 DIAGNOSIS — R69 Illness, unspecified: Secondary | ICD-10-CM | POA: Diagnosis not present

## 2019-06-18 DIAGNOSIS — Z888 Allergy status to other drugs, medicaments and biological substances status: Secondary | ICD-10-CM | POA: Diagnosis not present

## 2019-06-18 DIAGNOSIS — F17210 Nicotine dependence, cigarettes, uncomplicated: Secondary | ICD-10-CM | POA: Diagnosis present

## 2019-06-18 DIAGNOSIS — Z9114 Patient's other noncompliance with medication regimen: Secondary | ICD-10-CM | POA: Diagnosis not present

## 2019-06-18 LAB — LIPID PANEL
Cholesterol: 150 mg/dL (ref 0–200)
HDL: 45 mg/dL (ref >40)
LDL Cholesterol: 81 mg/dL (ref 0–99)
Total CHOL/HDL Ratio: 3.3 RATIO
Triglycerides: 118 mg/dL (ref <150)
VLDL: 24 mg/dL (ref 0–40)

## 2019-06-18 LAB — I-STAT BETA HCG BLOOD, ED (NOT ORDERABLE): I-stat hCG, quantitative: <5 m[IU]/mL (ref <5)

## 2019-06-18 LAB — HEMOGLOBIN A1C
Hgb A1c MFr Bld: 5.6 % (ref 4.8–5.6)
Mean Plasma Glucose: 114.02 mg/dL

## 2019-06-18 LAB — POCT PREGNANCY, URINE: Preg Test, Ur: NEGATIVE

## 2019-06-18 MED ORDER — HALOPERIDOL 5 MG PO TABS
5.0000 mg | ORAL_TABLET | Freq: Four times a day (QID) | ORAL | Status: DC | PRN
  Administered 2019-06-18 – 2019-06-21 (×3): 5 mg via ORAL
  Filled 2019-06-18 (×2): qty 1

## 2019-06-18 MED ORDER — LORAZEPAM 1 MG PO TABS
2.0000 mg | ORAL_TABLET | ORAL | Status: DC | PRN
  Administered 2019-06-18 – 2019-06-21 (×2): 2 mg via ORAL
  Filled 2019-06-18 (×2): qty 2

## 2019-06-18 MED ORDER — VITAMIN D3 25 MCG PO TABS
500.0000 [IU] | ORAL_TABLET | Freq: Every day | ORAL | Status: DC
  Administered 2019-06-19 – 2019-06-25 (×5): 500 [IU] via ORAL
  Filled 2019-06-18 (×8): qty 0.5
  Filled 2019-06-18: qty 1
  Filled 2019-06-18 (×2): qty 0.5

## 2019-06-18 MED ORDER — LORAZEPAM 2 MG/ML IJ SOLN
4.0000 mg | Freq: Once | INTRAMUSCULAR | Status: AC
  Administered 2019-06-18: 4 mg via INTRAMUSCULAR
  Filled 2019-06-18: qty 2

## 2019-06-18 MED ORDER — HALOPERIDOL LACTATE 5 MG/ML IJ SOLN
10.0000 mg | Freq: Four times a day (QID) | INTRAMUSCULAR | Status: DC | PRN
  Administered 2019-06-18: 10 mg via INTRAMUSCULAR
  Filled 2019-06-18 (×2): qty 2

## 2019-06-18 MED ORDER — LORAZEPAM 2 MG/ML IJ SOLN
2.0000 mg | INTRAMUSCULAR | Status: DC | PRN
  Filled 2019-06-18: qty 1

## 2019-06-18 NOTE — Progress Notes (Signed)
Pt continues to pace the unit , agitated, stating she is not going to sleep in her room, demanding to be taken to [place].

## 2019-06-18 NOTE — Progress Notes (Signed)
Pt up to the nursing station stating she had a heart attack, pt continues to be defiant on the unit, pt continues to refuse to sleep in her room.

## 2019-06-18 NOTE — Progress Notes (Signed)
   06/18/19 2000  Psych Admission Type (Psych Patients Only)  Admission Status Involuntary  Psychosocial Assessment  Patient Complaints Suspiciousness;Restlessness;Anxiety  Eye Contact Fair;Poor  Facial Expression Angry  Affect Angry;Labile;Apprehensive;Irritable  Speech Unremarkable  Interaction Defensive;Sarcastic  Motor Activity Other (Comment)  Appearance/Hygiene Disheveled  Behavior Characteristics Anxious;Agitated;Intrusive  Mood Labile;Suspicious;Preoccupied  Thought Process  Coherency WDL  Content Blaming self;Delusions  Delusions Paranoid  Perception WDL  Hallucination None reported or observed  Judgment WDL  Confusion None  Danger to Self  Current suicidal ideation? Denies  Danger to Others  Danger to Others None reported or observed   Pt paranoid about her room, stated people died in her room. Pt obsessed about blood, pt stated there was blood in her room, blood in the milk she was given.

## 2019-06-18 NOTE — Progress Notes (Signed)
Pt up pacing the unit stating she had 3 heart attacks. Pt showing no  S/S of being in distress. Pt will not go to room to sleep. Pt stated she wanted to go home. Pt laying down on bench by the phone.

## 2019-06-18 NOTE — Progress Notes (Signed)
Pt asked staff for a blanket and staff provided pt with a blanket. Pt became upset with Probation officer stating it was Clorox on her blanket. Pt throw blanket a Probation officer. Writer pick up blanket and provided Pt with a new blanket. Pt stated the new blanket had more Clorox on it then the 1st blanket writer provided.
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
## 2019-06-18 NOTE — Progress Notes (Signed)
Pt continues to be obsessed about smoking a cigarette, pt informed numerous times this is a smoke free facility , but pt continues to ask staff to take her outside to smoke a cigarette.

## 2019-06-18 NOTE — BH Assessment (Addendum)
[facility] Assessment Progress Note  Per Neita Garnet, MD, this pt requires psychiatric hospitalization at this time.  Jasmine has tentatively assigned pt to [facility] Rm 500-1 in anticipation of a discharge scheduled for later today.  Pt presents under IVC and a First Examination has been completed upholding the petition.  Pt's nurse, Felipa Eth, has been notified.  Jalene Mullet, [place] Coordinator [phone]

## 2019-06-18 NOTE — Progress Notes (Signed)
Per note pt was an IVC walk in and was transferred to [facility] for med clearance. Pt returned was cooperative, but irritable and sarcastic at times. Informed the writer that she "doesn't know why she's here". Stated, "All I wanted to do was get something to eat". Pt showed the writer her knuckles however, the writer wasn't able to distinguish any bruises or scratches. Pt became demanding once on the unit and stated that the water writer had given her was "cummy". Fussed and handed the water back for the writer to drink it. Writer also observed pt yelling out in the middle of the night "get out of my room, witch". However, at the time the mht was not in the room. Pt denies SI, HI, A/V.

## 2019-06-18 NOTE — Progress Notes (Signed)
  Pt is a caucasian female of 84 years, presents IVC from nursing facility with complaint of aggressive behaviors...    Patient present this morning with aggressive behavior, refused breakfast and all medication.  Unable to assess due to aggressive behavior towards staff.     Vitals signs obtained and being monitored, safety maintained with q15 minute checks.   Lolly Mustache. Bobby Rumpf MSN, [place], [facility] [phone]

## 2019-06-18 NOTE — Progress Notes (Signed)
Patient denies SI, HI and AVH this shift.  Patient has had no incidents of behavioral dyscontrol on the unit.  Patent has been compliant with medications, attended groups and engaged in unit activities.   Assess patient for safety offer medications as prescribe, offer 1:1 therapeutic talks.   Continue to monitor as planned.  Patient was able to contract for safety.

## 2019-06-18 NOTE — Plan of Care (Signed)
[place] Observation Crisis Plan  Reason for Crisis Plan:  Crisis Stabilization   Plan of Care:  Referral for Telepsychiatry/Psychiatric Consult  Family Support:      Current Living Environment:  Living Arrangements: Other (Comment)(ALF)  Insurance:   Hospital Account    Name Acct ID Class Status Primary Coverage   Martha Peters, Martha Peters [id number] [place]        Guarantor Account (for Hospital Account [id number])    Name Relation to Pt Service Area Active? Acct Type   Martha Peters Self [facility] Yes Behavioral Health   Address Phone       [facility] [address] [place], [place] [address] [phone](H)          Coverage Information (for Hospital Account [id number])    1. AETNA MEDICARE/AETNA MEDICARE HMO/PPO    F/O Payor/Plan Precert #   AETNA MEDICARE/AETNA MEDICARE HMO/PPO    Subscriber Subscriber #   Martha Peters, Martha Peters [id number]   Address [place]   [address] [place] [address] [phone]       2. [employer]AL [employer] MEDICAID    F/O Payor/Plan Precert #   [employer] MEDICAID    Subscriber Subscriber #   Martha Peters, Martha Peters [id number]   Address Phone   [address] [place], [place] [address] [phone]          Legal Guardian:     Primary Care Provider:  Patient, No Pcp Per  Current Outpatient Providers:  [facility]  Psychiatrist:     Counselor/Therapist:     Compliant with Medications:  No  Additional Information:   Martha Peters 2/2/20211:01 AM

## 2019-06-18 NOTE — Progress Notes (Signed)
Pt stating she can't sleep in the room because there is clorox in the room and it was burning her eyes pt stating she needs to go outside.

## 2019-06-18 NOTE — Progress Notes (Signed)
Pt accepted to [facility] Adult Unit; 500-01.      Shuvon Rankin NP is the accepting provider.    Dr. Jake Samples is the attending provider.    Call report to [phone].     Pt is IVC.    Patient may admit to the unit at 11:00am.   Domenic Schwab, MSW, [place] Disposition Social Worker [employer] [facility]/TTS [phone]

## 2019-06-18 NOTE — BH Assessment (Addendum)
[place] Assessment Progress Note  After reviewing pt's chart, this writer has ascertained that pt's legal guardian is her sister, [name] [phone]).  At 10:43 I called her.  She has since sent a copy of pt's letter of guardianship to me by e-mail attachment, and I have printed it.  I have informed her of pt's disposition.  I have also notified pt's nurse, Felipa Eth.  Jalene Mullet, [place] Coordinator [phone]

## 2019-06-18 NOTE — Progress Notes (Signed)
NOVEL CORONAVIRUS (COVID-19) DAILY CHECK-OFF SYMPTOMS - answer yes or no to each - every day NO YES  Have you had a fever in the past 24 hours?  . Fever (Temp > 37.80C / 100F) X   Have you had any of these symptoms in the past 24 hours? . New Cough .  Sore Throat  .  Shortness of Breath .  Difficulty Breathing .  Unexplained Body Aches   X   Have you had any one of these symptoms in the past 24 hours not related to allergies?   . Runny Nose .  Nasal Congestion .  Sneezing   X   If you have had runny nose, nasal congestion, sneezing in the past 24 hours, has it worsened?  X   EXPOSURES - check yes or no X   Have you traveled outside the state in the past 14 days?  X   Have you been in contact with someone with a confirmed diagnosis of COVID-19 or PUI in the past 14 days without wearing appropriate PPE?  X   Have you been living in the same home as a person with confirmed diagnosis of COVID-19 or a PUI (household contact)?    X   Have you been diagnosed with COVID-19?    X              What to do next: Answered NO to all: Answered YES to anything:   Proceed with unit schedule Follow the [facility] Inpatient Flowsheet.   Blossie Raffel K. Graysyn Bache MSN, RN, [facility] [phone] 

## 2019-06-18 NOTE — Discharge Summary (Signed)
  Patient to be transferred from [facility] Observation to [place] inpatient for psychiatric treatment.

## 2019-06-19 DIAGNOSIS — F250 Schizoaffective disorder, bipolar type: Principal | ICD-10-CM

## 2019-06-19 DIAGNOSIS — R69 Illness, unspecified: Secondary | ICD-10-CM | POA: Diagnosis not present

## 2019-06-19 MED ORDER — TEMAZEPAM 30 MG PO CAPS
30.0000 mg | ORAL_CAPSULE | Freq: Every day | ORAL | Status: DC
  Administered 2019-06-20 – 2019-06-24 (×4): 30 mg via ORAL
  Filled 2019-06-19 (×4): qty 1

## 2019-06-19 MED ORDER — HALOPERIDOL 5 MG PO TABS
5.0000 mg | ORAL_TABLET | Freq: Three times a day (TID) | ORAL | Status: DC
  Administered 2019-06-19 – 2019-06-20 (×4): 5 mg via ORAL
  Filled 2019-06-19 (×9): qty 1

## 2019-06-19 MED ORDER — ONDANSETRON 4 MG PO TBDP
4.0000 mg | ORAL_TABLET | Freq: Three times a day (TID) | ORAL | Status: DC | PRN
  Administered 2019-06-19 – 2019-06-20 (×2): 4 mg via ORAL
  Filled 2019-06-19 (×2): qty 1

## 2019-06-19 MED ORDER — RISPERIDONE 2 MG PO TBDP: 2.0000 mg | ORAL_TABLET | Freq: Two times a day (BID) | ORAL | Status: DC

## 2019-06-19 MED ORDER — VENLAFAXINE HCL ER 75 MG PO CP24
75.0000 mg | ORAL_CAPSULE | Freq: Every day | ORAL | Status: DC
  Administered 2019-06-23 – 2019-06-25 (×3): 75 mg via ORAL
  Filled 2019-06-19 (×8): qty 1

## 2019-06-19 NOTE — Progress Notes (Signed)
Recreation Therapy Notes  Date: 2.3.21 Time: 1000 Location: 500 Hall Dayroom  Group Topic: Leisure Education  Goal Area(s) Addresses:  Patient will identify positive leisure activities.  Patient will identify one positive benefit of participation in leisure activities.   Intervention: Leisure Group Game  Activity: LRT and patients played a Recruitment consultant.  Each person will get a slip of paper with an activity on it out of the container and draw it on the board.  The rest of the group will attempt the guess the activity being drawn.  The person with guesses correctly gets the next turn.   Education:  Leisure Education, Dentist  Education Outcome: Acknowledges education/In group clarification offered/Needs additional education  Clinical Observations/Feedback: Pt did not attend group session.     Victorino Sparrow, LRT/CTRS          Victorino Sparrow A 06/19/2019 11:29 AM
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
## 2019-06-19 NOTE — Progress Notes (Signed)
D:  Patient has been sleeping in her room today.  Respirations even and unlabored.  No signs/symptoms of pain/distress noted on patient's face/body movements. A:  Medications administered per MD orders.  Emotional support and encouragement given patient. R:  Safety maintained with 15 minute checks. :

## 2019-06-19 NOTE — Plan of Care (Signed)
Nurse discussed anxiety, depression and coping skills with patient.  

## 2019-06-19 NOTE — BHH Counselor (Signed)
CSW attempted to meet with patient for her assessment. Patient was asleep due to obtaining medications. This was patient's first attempt at PSA.

## 2019-06-19 NOTE — Progress Notes (Signed)
  Pt is caucasian  female of 31 years, presents IVC from [facility] facility  with complaints of aggressive behavior towards staff.      Pt walking and conversing with staff and residents using appropriate behaviors. Meals accepted. Pt asked to go outside to smoke, [facility] policy explained.  Pt states she has no pain.  Vitals signs WNL and being monitored.  Medication given as Rx, no adverse reaction observed, safety maintained with q15 minute checks.  Pt transferred to 500 hall.    Lolly Mustache. Bobby Rumpf MSN, [place], [facility] [phone]
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
## 2019-06-19 NOTE — Progress Notes (Signed)
The patient vomited in her bed prior to the start of this shift. Bedding was replaced, given new scrubs and towels.

## 2019-06-19 NOTE — Progress Notes (Signed)
The patient just vomited a second time a short time ago. She flushed the toilet before it could be observed.

## 2019-06-19 NOTE — Progress Notes (Signed)
   06/19/19 2100  Psych Admission Type (Psych Patients Only)  Admission Status Involuntary  Psychosocial Assessment  Patient Complaints Anxiety  Eye Contact Fair  Facial Expression Angry;Pensive  Affect Angry;Labile;Preoccupied  Speech Slow;Slurred  Interaction Assertive;Demanding  Motor Activity Slow;Unsteady  Appearance/Hygiene Disheveled;Poor hygiene  Behavior Characteristics Cooperative  Mood Anxious;Labile  Thought Process  Coherency Circumstantial  Content Blaming others;Confabulation  Delusions Controlled;Paranoid  Perception WDL  Hallucination None reported or observed  Judgment Poor  Confusion None  Danger to Self  Current suicidal ideation? Denies  Danger to Others  Danger to Others None reported or observed   Pt stated she had a few episodes of emesis , pt was given Zofran per [facility] , will continue to monitor. Pt stated she think she ate too much food.

## 2019-06-19 NOTE — Tx Team (Signed)
Interdisciplinary Treatment and Diagnostic Plan Update  06/19/2019 Time of Session: 11:05am Martha Peters MRN: [id number]  Principal Diagnosis: Schizoaffective disorder, bipolar type [facility])  Secondary Diagnoses: Principal Problem:   Schizoaffective disorder, bipolar type ([place])   Current Medications:  Current Facility-Administered Medications  Medication Dose Route Frequency Provider Last Rate Last Admin  . acetaminophen (TYLENOL) tablet 650 mg  650 mg Oral Q6H PRN [name], NP   650 mg at 06/18/19 1536  . alum & mag hydroxide-simeth (MAALOX/MYLANTA) 200-200-20 MG/5ML suspension 30 mL  30 mL Oral Q4H PRN [name], NP      . clonazePAM (KLONOPIN) tablet 0.5 mg  0.5 mg Oral Q breakfast [name], NP   0.5 mg at 06/19/19 0739  . clonazePAM (KLONOPIN) tablet 1 mg  1 mg Oral q1800 [name], MD   1 mg at 06/18/19 1711  . folic acid (FOLVITE) tablet 1 mg  1 mg Oral Daily [name], NP   1 mg at 06/19/19 0739  . haloperidol (HALDOL) tablet 5 mg  5 mg Oral Q6H PRN [name], MD   5 mg at 06/19/19 [id number]   Or  . haloperidol lactate (HALDOL) injection 10 mg  10 mg Intramuscular Q6H PRN [name], MD   10 mg at 06/18/19 1045  . haloperidol (HALDOL) tablet 5 mg  5 mg Oral TID [name], MD   5 mg at 06/19/19 1157  . levETIRAcetam (KEPPRA) tablet 500 mg  500 mg Oral BID [name], NP      . liothyronine (CYTOMEL) tablet 25 mcg  25 mcg Oral Daily [name], NP   25 mcg at 06/19/19 1200  . LORazepam (ATIVAN) tablet 2 mg  2 mg Oral Q4H PRN [name], MD   2 mg at 06/18/19 2108   Or  . LORazepam (ATIVAN) injection 2 mg  2 mg Intramuscular Q4H PRN [name], MD      . magnesium hydroxide (MILK OF MAGNESIA) suspension 30 mL  30 mL Oral Daily PRN Rozetta Nunnery, NP      . Derrill Memo ON 06/20/2019] venlafaxine XR (EFFEXOR-XR) 24 hr capsule 75 mg  75 mg Oral Q breakfast [name], MD      . vitamin B-12 (CYANOCOBALAMIN) tablet 100 mcg  100 mcg Oral Daily [name], NP   100 mcg at 06/19/19 0742  . Vitamin D3 (Vitamin D) tablet 500 Units  500 Units Oral Daily [name], MD   500 Units at 06/19/19 [id number]   PTA Medications: Medications Prior to Admission  Medication Sig Dispense Refill Last Dose  . clonazePAM (KLONOPIN) 0.5 MG tablet Take 0.5-1 mg by mouth See admin instructions. 0.5 MG in the AM and 1 MG in the evening   Unknown at Unknown time  . Cyanocobalamin (B-12) 100 MCG TABS Take 100 mcg by mouth daily.   Unknown at Unknown time  . folic acid (FOLVITE) 1 MG tablet Take 1 mg by mouth daily.   Unknown at Unknown time  . haloperidol (HALDOL) 2 MG tablet Take 1 tablet (2 mg total) by mouth 2 (two) times daily. 60 tablet 0 Unknown at Unknown time  . lamoTRIgine (LAMICTAL) 100 MG tablet Take 1 tablet (100 mg total) by mouth daily. 30 tablet 1 Unknown at Unknown time  . levETIRAcetam (KEPPRA) 500 MG tablet Take 500 mg by mouth 2 (two) times daily.   Unknown at Unknown time  . liothyronine (CYTOMEL) 25 MCG tablet Take  25 mcg by mouth daily.   Unknown at Unknown time  . venlafaxine XR (EFFEXOR-XR) 150 MG 24 hr capsule Take 150 mg by mouth daily with breakfast.   Unknown at Unknown time  . Vitamin D, Cholecalciferol, 10 MCG (400 UNIT) TABS Take 400 Units by mouth daily.   Unknown at Unknown time    Patient Stressors:    Patient Strengths:    Treatment Modalities: Medication Management, Group therapy, Case management,  1 to 1 session with clinician, Psychoeducation, Recreational therapy.   Physician Treatment Plan for Primary Diagnosis: Schizoaffective disorder, bipolar type ([place]) Long Term Goal(s): Improvement in symptoms so as ready for discharge Improvement in symptoms so as ready for discharge   Short Term Goals: Compliance with prescribed medications will improve Ability to identify triggers associated with substance abuse/mental health issues will improve Ability to maintain clinical measurements within normal limits will  improve Compliance with prescribed medications will improve  Medication Management: Evaluate patient's response, side effects, and tolerance of medication regimen.  Therapeutic Interventions: 1 to 1 sessions, Unit Group sessions and Medication administration.  Evaluation of Outcomes: Not Progressing  Physician Treatment Plan for Secondary Diagnosis: Principal Problem:   Schizoaffective disorder, bipolar type ([place])  Long Term Goal(s): Improvement in symptoms so as ready for discharge Improvement in symptoms so as ready for discharge   Short Term Goals: Compliance with prescribed medications will improve Ability to identify triggers associated with substance abuse/mental health issues will improve Ability to maintain clinical measurements within normal limits will improve Compliance with prescribed medications will improve     Medication Management: Evaluate patient's response, side effects, and tolerance of medication regimen.  Therapeutic Interventions: 1 to 1 sessions, Unit Group sessions and Medication administration.  Evaluation of Outcomes: Not Progressing   RN Treatment Plan for Primary Diagnosis: Schizoaffective disorder, bipolar type ([place]) Long Term Goal(s): Knowledge of disease and therapeutic regimen to maintain health will improve  Short Term Goals: Ability to participate in decision making will improve, Ability to verbalize feelings will improve, Ability to disclose and discuss suicidal ideas, Ability to identify and develop effective coping behaviors will improve and Compliance with prescribed medications will improve  Medication Management: RN will administer medications as ordered by provider, will assess and evaluate patient's response and provide education to patient for prescribed medication. RN will report any adverse and/or side effects to prescribing provider.  Therapeutic Interventions: 1 on 1 counseling sessions, Psychoeducation, Medication administration,  Evaluate responses to treatment, Monitor vital signs and CBGs as ordered, Perform/monitor CIWA, COWS, AIMS and Fall Risk screenings as ordered, Perform wound care treatments as ordered.  Evaluation of Outcomes: Not Progressing   LCSW Treatment Plan for Primary Diagnosis: Schizoaffective disorder, bipolar type ([place]) Long Term Goal(s): Safe transition to appropriate next level of care at discharge, Engage patient in therapeutic group addressing interpersonal concerns.  Short Term Goals: Engage patient in aftercare planning with referrals and resources and Increase skills for wellness and recovery  Therapeutic Interventions: Assess for all discharge needs, 1 to 1 time with Social worker, Explore available resources and support systems, Assess for adequacy in community support network, Educate family and significant other(s) on suicide prevention, Complete Psychosocial Assessment, Interpersonal group therapy.  Evaluation of Outcomes: Not Progressing   Progress in Treatment: Attending groups: No. Participating in groups: No. Taking medication as prescribed: No. Toleration medication: No. Family/Significant other contact made: No, will contact:  legal guardian (pt's sister) Patient understands diagnosis: No. Discussing patient identified problems/goals with staff: Yes. Medical problems stabilized  or resolved: Yes. Denies suicidal/homicidal ideation: Yes. Issues/concerns per patient self-inventory: No. Other:   New problem(s) identified: No, Describe:  None  New Short Term/Long Term Goal(s): Medication stabilization, elimination of SI thoughts, and development of a comprehensive mental wellness plan.   Patient Goals:  Patient did not attend treatment team today. Patient was asleep.   Discharge Plan or Barriers: CSW will continue to follow up for appropriate referrals and possible discharge planning  Reason for Continuation of Hospitalization: Aggression Medication  stabilization  Estimated Length of Stay: 3-5 days   Attendees: Patient:  06/19/2019   Physician: Dr. [name], MD  06/19/2019   Nursing:  06/19/2019   RN Care Manager: 06/19/2019   Social Worker: Ardelle Anton, [place]  06/19/2019   Recreational Therapist:  06/19/2019   Other:  06/19/2019  Other:  06/19/2019   Other: 06/19/2019     Scribe for Treatment Team: Trecia Rogers, LCSW 06/19/2019 1:58 PM

## 2019-06-20 MED ORDER — PROMETHAZINE HCL 25 MG/ML IJ SOLN
25.0000 mg | INTRAMUSCULAR | Status: DC | PRN
  Administered 2019-06-20: 25 mg via INTRAMUSCULAR

## 2019-06-20 MED ORDER — PROMETHAZINE HCL 25 MG RE SUPP
25.0000 mg | Freq: Three times a day (TID) | RECTAL | Status: DC | PRN
  Filled 2019-06-20 (×2): qty 1

## 2019-06-20 MED ORDER — ONDANSETRON 4 MG PO TBDP: 8.0000 mg | ORAL_TABLET | Freq: Three times a day (TID) | ORAL | Status: DC | PRN

## 2019-06-20 NOTE — BHH Group Notes (Signed)
LCSW Group Therapy Notes  Date and Time: 06/20/2019 @ 1:30pm  Type of Therapy and Topic: Group Therapy: Core Beliefs  Participation Level: BHH PARTICIPATION LEVEL: Active  Description of Group: In this group patients will be encouraged to explore their negative and positive core beliefs about themselves, others, and the world. Each patient will be challenged to identify these beliefs and ways to challenge negative core beliefs. This group will be process-oriented, with patients participating in exploration of their own experiences as well as giving and receiving support and challenge from other group members.  Therapeutic Goals: 1. Patient will identify personal core beliefs, both negative and positive. 2. Patient will identify core beliefs relating to others, both negative and positive. 3. Patient will challenge their negative beliefs about themselves and others. 4. Patient will identify three changes they can make to replace negative core beliefs with positive beliefs.  Summary of Patient Progress  Due to the COVID-19 pandemic and acuity of unit, this group has been supplemented with worksheets.  Therapeutic Modalities: Cognitive Behavioral Therapy Solution Focused Therapy Motivational Interviewing     Ardelle Anton, MSW, [place]

## 2019-06-20 NOTE — Progress Notes (Signed)
Recreation Therapy Notes  2.4.21  LRT unable to complete assessment with patient due to patient being asleep and not feeling well.  LRT will attempt to complete assessment with pt tomorrow.    Martha Peters, LRT/CTRS    Martha Peters A 06/20/2019 3:22 PM

## 2019-06-20 NOTE — BHH Suicide Risk Assessment (Signed)
[place] INPATIENT:  Family/Significant Other Suicide Prevention Education  Suicide Prevention Education:  Education Completed; Pt's sister/legal guardian, Martha Peters, has been identified by the patient as the family member/significant other with whom the patient will be residing, and identified as the person(s) who will aid the patient in the event of a mental health crisis (suicidal ideations/suicide attempt).  With written consent from the patient, the family member/significant other has been provided the following suicide prevention education, prior to the and/or following the discharge of the patient.  The suicide prevention education provided includes the following:  Suicide risk factors  Suicide prevention and interventions  National Suicide Hotline telephone number  [facility] assessment telephone number  [facility] Emergency Assistance [place] and/or Residential Mobile Crisis Unit telephone number  Request made of family/significant other to:  Remove weapons (e.g., guns, rifles, knives), all items previously/currently identified as safety concern.    Remove drugs/medications (over-the-counter, prescriptions, illicit drugs), all items previously/currently identified as a safety concern.  The family member/significant other verbalizes understanding of the suicide prevention education information provided.  The family member/significant other agrees to remove the items of safety concern listed above.  CSW contacted pt's sister/legal guardian, Martha Peters. Pt's legal guardian stated that she became her legal guardian about a year ago when the patient continued to sign herself out of placements. Pt's legal guardian stated that the patient refuses to take her medication and the older that she gets the more combative that she gets. Pt's legal guardian was pleased to know that the patient can go back to her ALF. Patient's legal guardian stated that she would  like to continue to get updates and praised the doctor for calling her. She stated that a doctor has not talked to her in about 2 years.   Martha Peters 06/20/2019, 3:23 PM
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
## 2019-06-20 NOTE — Progress Notes (Signed)
  Pt experienced several episodes of vomiting this shift. Symptoms relieved with phenergan IM injection.  Pt up in dayroom at the end of the shift watching television in dayroom.     06/20/19 0745  Psych Admission Type (Psych Patients Only)  Admission Status Involuntary  Psychosocial Assessment  Patient Complaints Anxiety  Eye Contact Fair  Facial Expression Angry;Pensive  Affect Angry;Labile;Preoccupied  Speech Slow;Slurred  Interaction Assertive  Motor Activity Slow;Unsteady  Appearance/Hygiene Disheveled;Poor hygiene  Behavior Characteristics Cooperative  Mood Anxious;Labile  Thought Process  Coherency Circumstantial  Content Blaming others;Confabulation  Delusions Controlled;Paranoid  Perception WDL  Hallucination None reported or observed  Judgment Poor  Confusion None  Danger to Self  Current suicidal ideation? Denies  Danger to Others  Danger to Others None reported or observed

## 2019-06-20 NOTE — Progress Notes (Signed)
Recreation Therapy Notes  Date: 2.4.21 Time: 1000 Location: [address] Dayroom  Group Topic: Self-Esteem  Goal Area(s) Addresses:  Patient will successfully identify positive attributes about themselves.  Patient will successfully identify benefit of improved self-esteem.   Intervention: Blank masks, colored pencils, glue sticks, magazines and scissors  Activity: Art Masks.  Patients were to take a blank mask and create a mask that highlights some of the positive qualities and attributes they have.  Patients could also use the magazines to find pictures/words that explain positive qualities about them.    Education:  Self-Esteem, Dentist.   Education Outcome: Acknowledges education/In group clarification offered/Needs additional education  Clinical Observations/Feedback: Pt did not attend group.    Victorino Sparrow, LRT/CTRS         Ria Comment, Makaylia Hewett A 06/20/2019 11:12 AM
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
## 2019-06-20 NOTE — Progress Notes (Signed)
The patient's positive event for the day is that she stopped vomiting. Her goal for tomorrow is to get discharged as soon as possible.

## 2019-06-20 NOTE — Progress Notes (Signed)
Patient ID: Martha Peters, female   DOB: Oct 24, 1965, 54 y.o.   MRN: [id number]   [facility] contacted pt's ALF, [facility], at [phone].   ALF stated that the patient WILL be allowed back to the facility but they want documentation and information of any changes while in the hospital.  Fax number is: [phone] (Attn: Cephus Shelling)      CSW will fax over H&P, Medication list, Facesheet, and other additional information.
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
## 2019-06-21 MED ORDER — HALOPERIDOL LACTATE 5 MG/ML IJ SOLN
10.0000 mg | Freq: Two times a day (BID) | INTRAMUSCULAR | Status: DC | PRN
  Administered 2019-06-21 – 2019-06-22 (×3): 10 mg via INTRAMUSCULAR
  Filled 2019-06-21 (×3): qty 2

## 2019-06-21 MED ORDER — CLOZAPINE 25 MG PO TABS
25.0000 mg | ORAL_TABLET | Freq: Two times a day (BID) | ORAL | Status: DC
  Filled 2019-06-21: qty 1

## 2019-06-21 MED ORDER — HALOPERIDOL 5 MG PO TABS
10.0000 mg | ORAL_TABLET | Freq: Three times a day (TID) | ORAL | Status: DC
  Administered 2019-06-23 – 2019-06-25 (×7): 10 mg via ORAL
  Filled 2019-06-21 (×19): qty 2

## 2019-06-21 MED ORDER — CLONAZEPAM 0.5 MG PO TABS
0.5000 mg | ORAL_TABLET | Freq: Two times a day (BID) | ORAL | Status: DC
  Administered 2019-06-22 – 2019-06-25 (×6): 0.5 mg via ORAL
  Filled 2019-06-21 (×6): qty 1

## 2019-06-21 NOTE — Progress Notes (Signed)
   06/21/19 2100  Psych Admission Type (Psych Patients Only)  Admission Status Involuntary  Psychosocial Assessment  Patient Complaints Anxiety;Restlessness;Suspiciousness  Eye Contact Fair  Facial Expression Angry;Anxious  Affect Angry;Labile;Preoccupied  Speech Slow;Slurred  Interaction Assertive  Motor Activity Slow;Unsteady  Appearance/Hygiene Unremarkable  Behavior Characteristics Cooperative;Anxious;Intrusive  Mood Suspicious;Anxious  Thought Process  Coherency Circumstantial  Content Blaming others  Delusions Paranoid  Perception WDL  Hallucination None reported or observed  Judgment Poor  Confusion None  Danger to Self  Current suicidal ideation? Denies  Danger to Others  Danger to Others None reported or observed   Pt continues to pace the unit and respond to internal stimuli.

## 2019-06-21 NOTE — Progress Notes (Signed)
Pt up talking to people not seen pacing the unit. Pt given PRN 5 mg Haldol per [facility]

## 2019-06-21 NOTE — Progress Notes (Signed)
Patient ID: Martha Peters, female   DOB: 1965-06-04, 54 y.o.   MRN: [id number]   CSW was contacted by Joycelyn Schmid [phone]) at [facility], the assisted living facility that the patient is from.    [name] asked for documentation from the hospital and that she did not receive it yesterday.   CSW faxed over the signed FL2, facesheet, H&P, and additional information to [phone].

## 2019-06-21 NOTE — Progress Notes (Signed)
Recreation Therapy Notes  Date: 2.5.21 Time: 1000 Location: 500 Hall Dayroom  Group Topic: Triggers  Goal Area(s) Addresses:  Patient will identify their 3 biggest triggers. Patient will verbalize how to avoid triggers. Patient will verbalize how to deal with triggers head on.  Behavioral Response: None  Intervention: Worksheet, music  Activity: Triggers.  Patients were to identify their 3 biggest triggers.  Patients then identified how to deal with triggers head on and how to avoid triggers.  Education: Triggers, Discharge Planning  Education Outcome: Acknowledges understanding/In group clarification offered/Needs additional education.   Clinical Observations/Feedback: Pt sat and listened to the music.  Pt was also plucking a juice carton like it was an instrument.   Victorino Sparrow, LRT/CTRS         Victorino Sparrow A 06/21/2019 12:05 PM

## 2019-06-21 NOTE — Progress Notes (Signed)
SPIRITUALITY GROUP NOTE  Spirituality group facilitated by Simone Curia, MDiv, [place].  Group Description:  Group focused on topic of hope.  Patients participated in facilitated discussion around topic, connecting with one another around experiences and definitions for hope.  Group members engaged with visual explorer photos, reflecting on what hope looks like for them today.  Group engaged in discussion around how their definitions of hope are present today in hospital.   Modalities: Psycho-social ed, Adlerian, Narrative, MI Patient Progress:  PT INVITED.  DID NOT ATTEND
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
## 2019-06-21 NOTE — Progress Notes (Signed)
Pt pacing up and down in the hallway throughout the day.  Stated that she was exercising because it helped "clear my head".  Pt declined to take any morning or afternoon PO medications this shift.  Agreeable to IM Haldol injection.  Pt resting in bed at 1805.  Pt remains safe at this time.  RN will continue to provide support and assist as needed.     06/21/19 1000  Psych Admission Type (Psych Patients Only)  Admission Status Involuntary  Psychosocial Assessment  Patient Complaints Anxiety  Eye Contact Fair  Facial Expression Angry;Anxious  Affect Angry;Labile;Preoccupied  Speech Slow;Slurred  Interaction Assertive  Motor Activity Slow;Unsteady  Appearance/Hygiene Unremarkable  Behavior Characteristics Anxious  Mood Anxious;Angry  Thought Process  Coherency Circumstantial  Content Blaming others  Delusions Paranoid  Perception WDL  Hallucination None reported or observed  Judgment Poor  Confusion None  Danger to Self  Current suicidal ideation? Denies  Danger to Others  Danger to Others None reported or observed

## 2019-06-21 NOTE — Progress Notes (Signed)
Recreation Therapy Notes  INPATIENT RECREATION THERAPY ASSESSMENT  Patient Details Name: Martha Peters MRN: [id number] DOB: Oct 24, 1965 Today's Date: 06/21/2019       Information Obtained From: Patient  Able to Participate in Assessment/Interview: Yes  Patient Presentation: Alert  Reason for Admission (Per Patient): Patient Unable to Identify  Patient Stressors: (None identified)  Coping Skills:   Music, Exercise, Art, Prayer  Leisure Interests (2+):  Exercise - Walking, Individual - TV, Music - Listen  Frequency of Recreation/Participation: Other (Comment)(Daily)  Awareness of Community Resources:  No  Expressed Interest in [name] Information: No  County of Residence:  [place]  Patient Main Form of Transportation: Walk  Patient Strengths:  Help people; 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

## 2019-06-22 DIAGNOSIS — R69 Illness, unspecified: Secondary | ICD-10-CM | POA: Diagnosis not present

## 2019-06-22 NOTE — Progress Notes (Signed)
Pt awake in dayroom during groups. Observed interacting well with staff and peers. Denies SI, HI, AVH and pain at this time. Presents very agitated earlier this shift when approached with PO medications at the med window "The doctor told me I can get a shot, I don't want no pills, I'm just going to refuse it, I'm not taking all that shit". Pt refused all morning medications via PO route. Required multiple prompts and encouragement to get OOB and participate in unit activities. Emotional support and availability offered. Q 15 minutes safety checks maintained. All medications administered as ordered with verbal education and effects monitored.  Tolerated all PO intake well without emesis and medications well via IM route. Denies adverse drug reactions. Remains safe on and off unit without issues.

## 2019-06-22 NOTE — [facility] Group Notes (Signed)
Adult Psychoeducational Group Note  Date:  06/22/2019 Time:  12:03 PM  Group Topic/Focus:  Goals Group:   The focus of this group is to help patients establish daily goals to achieve during treatment and discuss how the patient can incorporate goal setting into their daily lives to aide in recovery.  Participation Level:  Did Not Attend   Paulino Rily 06/22/2019, 12:03 PM
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
## 2019-06-22 NOTE — [facility] Group Notes (Signed)
  [facility] LCSW Group Therapy Note  Date/Time:  06/22/2019 11:05AM-11:40AM  Type of Therapy and Topic:  Group Therapy:  Feelings About Hospitalization  Participation Level:  Did Not Attend   Description of Group This process group involved patients discussing their feelings related to being hospitalized, as well as the benefits they see to being in the hospital.  These feelings and benefits were itemized.  The group then brainstormed specific ways in which they could seek those same benefits when they discharge and return home.  Therapeutic Goals Patient will identify and describe positive and negative feelings related to hospitalization Patient will verbalize benefits of hospitalization to themselves personally Patients will brainstorm together ways they can obtain similar benefits in the outpatient setting, identify barriers to wellness and possible solutions  Summary of Patient Progress:  Did Not Attend - was invited individually by MHT and chose not to attend.  Therapeutic Modalities Cognitive Behavioral Therapy Motivational Interviewing    Sherren Mocha, [place] 06/22/2019, 1:23PM
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
## 2019-06-23 NOTE — [facility] Group Notes (Signed)
Adult Psychoeducational Group Note  Date:  06/23/2019 Time:  11:55 AM  Group Topic/Focus:  Diagnosis Education:   The focus of this group is to discuss the major disorders that patients maybe diagnosed with.  Group discusses the importance of knowing what one's diagnosis is so that one can understand treatment and better advocate for oneself.  Participation Level:  Did Not Attend   Martha Peters 06/23/2019, 11:55 AM

## 2019-06-23 NOTE — Progress Notes (Signed)
   06/22/19 1011  Psych Admission Type (Psych Patients Only)  Admission Status Involuntary  Psychosocial Assessment  Patient Complaints Anxiety;Restlessness;Suspiciousness  Eye Contact Fair  Facial Expression Anxious;Angry  Affect Appropriate to circumstance  Speech Slow  Interaction Assertive  Motor Activity Slow  Appearance/Hygiene Unremarkable  Behavior Characteristics Cooperative  Mood Anxious;Labile;Preoccupied;Irritable  Thought Process  Coherency Circumstantial  Content Blaming others  Delusions Paranoid  Perception WDL  Hallucination None reported or observed  Judgment Poor  Confusion None  Danger to Self  Current suicidal ideation? Denies  Danger to Others  Danger to Others None reported or observed

## 2019-06-23 NOTE — [facility] Group Notes (Signed)
[facility] LCSW Group Therapy Note  Date/Time:  06/23/2019  11:10AM-12:00PM  Type of Therapy and Topic:  Group Therapy:  Music and Mood  Participation Level:  Did Not Attend   Description of Group: In this process group, members listened to a variety of genres of music and identified that different types of music evoke different responses.  Patients were encouraged to identify music that was soothing for them and music that was energizing for them.  Patients discussed how this knowledge can help with wellness and recovery in various ways including managing depression and anxiety as well as encouraging healthy sleep habits.    Therapeutic Goals: 1. Patients will explore the impact of different varieties of music on mood 2. Patients will verbalize the thoughts they have when listening to different types of music 3. Patients will identify music that is soothing to them as well as music that is energizing to them 4. Patients will discuss how to use this knowledge to assist in maintaining wellness and recovery 5. Patients will explore the use of music as a coping skill  Summary of Patient Progress:  Did Not Attend - was invited individually by MHT and chose not to attend.    Sherren Mocha, [place] 06/23/2019, 1:01 PM

## 2019-06-23 NOTE — Progress Notes (Signed)
D. Pt presents as calm and cooperative upon initial approach at the med window this am- compliant with all meds. Currently denies SI/HI and A/V H. Per pt's self inventory, pt rated her depression, hopelessness and anxiety a 3/0/0, respectively. Per self inventory, pt reports good sleep and appetite, and reports having poor concentration and low energy level. Pt writes that her goal today is to "leave".  A. Labs and vitals monitored. Pt supported emotionally and encouraged to express concerns and ask questions.   R. Pt remains safe with 15 minute checks. Will continue POC.

## 2019-06-24 DIAGNOSIS — R69 Illness, unspecified: Secondary | ICD-10-CM | POA: Diagnosis not present

## 2019-06-24 LAB — RESPIRATORY PANEL BY RT PCR (FLU A&B, COVID)
Influenza A by PCR: NEGATIVE
Influenza B by PCR: NEGATIVE
SARS Coronavirus 2 by RT PCR: NEGATIVE

## 2019-06-24 MED ORDER — HALOPERIDOL DECANOATE 100 MG/ML IM SOLN
50.0000 mg | INTRAMUSCULAR | Status: DC
  Administered 2019-06-24: 50 mg via INTRAMUSCULAR
  Filled 2019-06-24: qty 0.5

## 2019-06-24 NOTE — Tx Team (Signed)
Interdisciplinary Treatment and Diagnostic Plan Update  06/24/2019 Time of Session: 10:45am Martha Peters MRN: [id number]  Principal Diagnosis: Schizoaffective disorder, bipolar type [facility])  Secondary Diagnoses: Principal Problem:   Schizoaffective disorder, bipolar type ([place])   Current Medications:  Current Facility-Administered Medications  Medication Dose Route Frequency Provider Last Rate Last Admin  . acetaminophen (TYLENOL) tablet 650 mg  650 mg Oral Q6H PRN [name], NP   650 mg at 06/21/19 2046  . alum & mag hydroxide-simeth (MAALOX/MYLANTA) 200-200-20 MG/5ML suspension 30 mL  30 mL Oral Q4H PRN [name], NP   30 mL at 06/21/19 1522  . clonazePAM (KLONOPIN) tablet 0.5 mg  0.5 mg Oral BID [name], MD   0.5 mg at 06/24/19 0747  . folic acid (FOLVITE) tablet 1 mg  1 mg Oral Daily [name], NP   1 mg at 06/24/19 0746  . haloperidol (HALDOL) tablet 10 mg  10 mg Oral TID [name], MD   10 mg at 06/24/19 1157  . haloperidol (HALDOL) tablet 5 mg  5 mg Oral Q6H PRN [name], MD   5 mg at 06/21/19 [id number]   Or  . haloperidol lactate (HALDOL) injection 10 mg  10 mg Intramuscular Q6H PRN [name], MD   10 mg at 06/18/19 1045  . haloperidol decanoate (HALDOL DECANOATE) 100 MG/ML injection 50 mg  50 mg Intramuscular Q30 days [name], MD      . haloperidol lactate (HALDOL) injection 10 mg  10 mg Intramuscular BID PRN [name], MD   10 mg at 06/22/19 [id number]  . levETIRAcetam (KEPPRA) tablet 500 mg  500 mg Oral BID [name], NP   500 mg at 06/24/19 0746  . liothyronine (CYTOMEL) tablet 25 mcg  25 mcg Oral Daily [name], NP   25 mcg at 06/24/19 0746  . LORazepam (ATIVAN) tablet 2 mg  2 mg Oral Q4H PRN [name], MD   2 mg at 06/21/19 1902   Or  . LORazepam (ATIVAN) injection 2 mg  2 mg Intramuscular Q4H PRN [name], MD      . magnesium hydroxide (MILK OF MAGNESIA) suspension 30 mL  30 mL Oral Daily PRN [name], NP      .  ondansetron (ZOFRAN-ODT) disintegrating tablet 4 mg  4 mg Oral Q8H PRN [name], NP   4 mg at 06/20/19 0923  . ondansetron (ZOFRAN-ODT) disintegrating tablet 8 mg  8 mg Oral Q8H PRN [name], MD      . promethazine (PHENERGAN) injection 25 mg  25 mg Intramuscular Q4H PRN [name], MD   25 mg at 06/20/19 1426  . promethazine (PHENERGAN) suppository 25 mg  25 mg Rectal Q8H PRN [name], [name], NP      . temazepam (RESTORIL) capsule 30 mg  30 mg Oral QHS [name], MD   30 mg at 06/22/19 2057  . venlafaxine XR (EFFEXOR-XR) 24 hr capsule 75 mg  75 mg Oral Q breakfast [name], MD   75 mg at 06/24/19 0746  . vitamin B-12 (CYANOCOBALAMIN) tablet 100 mcg  100 mcg Oral Daily [name], NP   100 mcg at 06/24/19 0746  . Vitamin D3 (Vitamin D) tablet 500 Units  500 Units Oral Daily [name], MD   500 Units at 06/24/19 0746   PTA Medications: Medications Prior to Admission  Medication Sig Dispense Refill Last Dose  . clonazePAM (KLONOPIN) 0.5 MG tablet Take  0.5-1 mg by mouth See admin instructions. 0.5 MG in the AM and 1 MG in the evening   Unknown at Unknown time  . Cyanocobalamin (B-12) 100 MCG TABS Take 100 mcg by mouth daily.   Unknown at Unknown time  . folic acid (FOLVITE) 1 MG tablet Take 1 mg by mouth daily.   Unknown at Unknown time  . haloperidol (HALDOL) 2 MG tablet Take 1 tablet (2 mg total) by mouth 2 (two) times daily. 60 tablet 0 Unknown at Unknown time  . lamoTRIgine (LAMICTAL) 100 MG tablet Take 1 tablet (100 mg total) by mouth daily. 30 tablet 1 Unknown at Unknown time  . levETIRAcetam (KEPPRA) 500 MG tablet Take 500 mg by mouth 2 (two) times daily.   Unknown at Unknown time  . liothyronine (CYTOMEL) 25 MCG tablet Take 25 mcg by mouth daily.   Unknown at Unknown time  . venlafaxine XR (EFFEXOR-XR) 150 MG 24 hr capsule Take 150 mg by mouth daily with breakfast.   Unknown at Unknown time  . Vitamin D, Cholecalciferol, 10 MCG (400 UNIT) TABS Take 400 Units by  mouth daily.   Unknown at Unknown time    Patient Stressors:    Patient Strengths:    Treatment Modalities: Medication Management, Group therapy, Case management,  1 to 1 session with clinician, Psychoeducation, Recreational therapy.   Physician Treatment Plan for Primary Diagnosis: Schizoaffective disorder, bipolar type ([place]) Long Term Goal(s): Improvement in symptoms so as ready for discharge Improvement in symptoms so as ready for discharge   Short Term Goals: Compliance with prescribed medications will improve Ability to identify triggers associated with substance abuse/mental health issues will improve Ability to maintain clinical measurements within normal limits will improve Compliance with prescribed medications will improve  Medication Management: Evaluate patient's response, side effects, and tolerance of medication regimen.  Therapeutic Interventions: 1 to 1 sessions, Unit Group sessions and Medication administration.  Evaluation of Outcomes: Progressing  Physician Treatment Plan for Secondary Diagnosis: Principal Problem:   Schizoaffective disorder, bipolar type ([place])  Long Term Goal(s): Improvement in symptoms so as ready for discharge Improvement in symptoms so as ready for discharge   Short Term Goals: Compliance with prescribed medications will improve Ability to identify triggers associated with substance abuse/mental health issues will improve Ability to maintain clinical measurements within normal limits will improve Compliance with prescribed medications will improve     Medication Management: Evaluate patient's response, side effects, and tolerance of medication regimen.  Therapeutic Interventions: 1 to 1 sessions, Unit Group sessions and Medication administration.  Evaluation of Outcomes: Progressing   RN Treatment Plan for Primary Diagnosis: Schizoaffective disorder, bipolar type ([place]) Long Term Goal(s): Knowledge of disease and therapeutic regimen  to maintain health will improve  Short Term Goals: Ability to participate in decision making will improve, Ability to verbalize feelings will improve, Ability to disclose and discuss suicidal ideas, Ability to identify and develop effective coping behaviors will improve and Compliance with prescribed medications will improve  Medication Management: RN will administer medications as ordered by provider, will assess and evaluate patient's response and provide education to patient for prescribed medication. RN will report any adverse and/or side effects to prescribing provider.  Therapeutic Interventions: 1 on 1 counseling sessions, Psychoeducation, Medication administration, Evaluate responses to treatment, Monitor vital signs and CBGs as ordered, Perform/monitor CIWA, COWS, AIMS and Fall Risk screenings as ordered, Perform wound care treatments as ordered.  Evaluation of Outcomes: Progressing   LCSW Treatment Plan for Primary Diagnosis: Schizoaffective  disorder, bipolar type [facility]) Long Term Goal(s): Safe transition to appropriate next level of care at discharge, Engage patient in therapeutic group addressing interpersonal concerns.  Short Term Goals: Engage patient in aftercare planning with referrals and resources and Increase skills for wellness and recovery  Therapeutic Interventions: Assess for all discharge needs, 1 to 1 time with Social worker, Explore available resources and support systems, Assess for adequacy in community support network, Educate family and significant other(s) on suicide prevention, Complete Psychosocial Assessment, Interpersonal group therapy.  Evaluation of Outcomes: Progressing   Progress in Treatment: Attending groups: No. Participating in groups: No. Taking medication as prescribed: Yes. Toleration medication: Yes. Family/Significant other contact made: Yes, individual(s) contacted:  pt's legal guardian/sister Patient understands diagnosis: No. Discussing  patient identified problems/goals with staff: Yes. Medical problems stabilized or resolved: Yes. Denies suicidal/homicidal ideation: Yes. Issues/concerns per patient self-inventory: No. Other:   New problem(s) identified: No, Describe:  None  New Short Term/Long Term Goal(s): Medication stabilization, elimination of SI thoughts, and development of a comprehensive mental wellness plan.   Patient Goals:  Patient did not come to treatment team.   Discharge Plan or Barriers: Patient will be going back to her ALF and they provide her mental health therapy and medication management at the facility.   Reason for Continuation of Hospitalization: Medication stabilization  Estimated Length of Stay: 1-2 days   Attendees: Patient:   06/24/2019   Physician: Dr. [name], MD  06/24/2019   Nursing: Legrand Como, RN  06/24/2019   RN Care Manager: 06/24/2019   Social Worker: Ardelle Anton, LCSW  06/24/2019   Recreational Therapist:  06/24/2019  Other:  06/24/2019  Other:  06/24/2019   Other: 06/24/2019      Scribe for Treatment Team: Trecia Rogers, LCSW 06/24/2019 12:50 PM

## 2019-06-24 NOTE — Progress Notes (Signed)
Patient ID: Martha Peters, female   DOB: 05/26/1965, 54 y.o.   MRN: [id number]   CSW contacted [facility] to inform them that the patient will be discharged from [facility] tomorrow and will be coming back to the ALF.  Patient is getting her COVID test today.

## 2019-06-24 NOTE — BHH Counselor (Signed)
[facility] [employer], requested COVID results and updated physician's progress notes to be faxed prior to discharge. Fax: [place], MSW, [place] Social Worker [facility]  [phone]

## 2019-06-24 NOTE — Progress Notes (Signed)
[facility] MD Progress Note  06/24/2019 11:39 AM Martha Peters  MRN:  [id number] Subjective:    Patient is generally contained behaviorally she is not agitated or pacing she denies thoughts of harming self or others she is compliant with medications here.  We discussed long-acting injectable she resists but understands this may be necessary prior to discharge  denies auditory visual hallucinations today No involuntary movements  Principal Problem: Schizoaffective disorder, bipolar type (H[facility]) Diagnosis: Principal Problem:   Schizoaffective disorder, bipolar type ([place])  Total Time spent with patient: 20 minutes  Past Psychiatric History: see eval  Past Medical History:  Past Medical History:  Diagnosis Date  . Schizoaffective disorder [facility])     Past Surgical History:  Procedure Laterality Date  . CHOLECYSTECTOMY     Family History:  Family History  Problem Relation Age of Onset  . Diabetes Maternal Grandmother    Family Psychiatric  History: see eval Social History:  Social History   Substance and Sexual Activity  Alcohol Use Never     Social History   Substance and Sexual Activity  Drug Use Never    Social History   Socioeconomic History  . Marital status: Divorced    Spouse name: Not on file  . Number of children: 0  . Years of education: Not on file  . Highest education level: High school graduate  Occupational History  . Not on file  Tobacco Use  . Smoking status: Current Every Day Smoker    Packs/day: 1.00    Types: Cigarettes  . Smokeless tobacco: Never Used  Substance and Sexual Activity  . Alcohol use: Never  . Drug use: Never  . Sexual activity: Not Currently  Other Topics Concern  . Not on file  Social History Narrative  . Not on file   Social Determinants of Health   Financial Resource Strain:   . Difficulty of Paying Living Expenses: Not on file  Food Insecurity:   . Worried About Charity fundraiser in the Last Year: Not on file  . Ran Out  of Food in the Last Year: Not on file  Transportation Needs:   . Lack of Transportation (Medical): Not on file  . Lack of Transportation (Non-Medical): Not on file  Physical Activity:   . Days of Exercise per Week: Not on file  . Minutes of Exercise per Session: Not on file  Stress:   . Feeling of Stress : Not on file  Social Connections:   . Frequency of Communication with Friends and Family: Not on file  . Frequency of Social Gatherings with Friends and Family: Not on file  . Attends Religious Services: Not on file  . Active Member of Clubs or Organizations: Not on file  . Attends Archivist Meetings: Not on file  . Marital Status: Not on file   Additional Social History:    Pain Medications: See MAR Prescriptions: See MAR Over the Counter: See MAR History of alcohol / drug use?: No history of alcohol / drug abuse                    Sleep: Fair  Appetite:  Fair  Current Medications: Current Facility-Administered Medications  Medication Dose Route Frequency Provider Last Rate Last Admin  . acetaminophen (TYLENOL) tablet 650 mg  650 mg Oral Q6H PRN [name], NP   650 mg at 06/21/19 2046  . alum & mag hydroxide-simeth (MAALOX/MYLANTA) 200-200-20 MG/5ML suspension 30 mL  30 mL  Oral Q4H PRN [name], NP   30 mL at 06/21/19 1522  . clonazePAM (KLONOPIN) tablet 0.5 mg  0.5 mg Oral BID [name], MD   0.5 mg at 06/24/19 0747  . folic acid (FOLVITE) tablet 1 mg  1 mg Oral Daily [name], NP   1 mg at 06/24/19 0746  . haloperidol (HALDOL) tablet 10 mg  10 mg Oral TID [name], MD   10 mg at 06/24/19 0746  . haloperidol (HALDOL) tablet 5 mg  5 mg Oral Q6H PRN [name], MD   5 mg at 06/21/19 [id number]   Or  . haloperidol lactate (HALDOL) injection 10 mg  10 mg Intramuscular Q6H PRN [name], MD   10 mg at 06/18/19 1045  . haloperidol lactate (HALDOL) injection 10 mg  10 mg Intramuscular BID PRN [name], MD   10 mg at 06/22/19 [id number]  .  levETIRAcetam (KEPPRA) tablet 500 mg  500 mg Oral BID [name], NP   500 mg at 06/24/19 0746  . liothyronine (CYTOMEL) tablet 25 mcg  25 mcg Oral Daily [name], NP   25 mcg at 06/24/19 0746  . LORazepam (ATIVAN) tablet 2 mg  2 mg Oral Q4H PRN [name], MD   2 mg at 06/21/19 1902   Or  . LORazepam (ATIVAN) injection 2 mg  2 mg Intramuscular Q4H PRN [name], MD      . magnesium hydroxide (MILK OF MAGNESIA) suspension 30 mL  30 mL Oral Daily PRN [name], NP      . ondansetron (ZOFRAN-ODT) disintegrating tablet 4 mg  4 mg Oral Q8H PRN [name], NP   4 mg at 06/20/19 0923  . ondansetron (ZOFRAN-ODT) disintegrating tablet 8 mg  8 mg Oral Q8H PRN [name], MD      . promethazine (PHENERGAN) injection 25 mg  25 mg Intramuscular Q4H PRN [name], MD   25 mg at 06/20/19 1426  . promethazine (PHENERGAN) suppository 25 mg  25 mg Rectal Q8H PRN [name], [name], NP      . temazepam (RESTORIL) capsule 30 mg  30 mg Oral QHS [name], MD   30 mg at 06/22/19 2057  . venlafaxine XR (EFFEXOR-XR) 24 hr capsule 75 mg  75 mg Oral Q breakfast [name], MD   75 mg at 06/24/19 0746  . vitamin B-12 (CYANOCOBALAMIN) tablet 100 mcg  100 mcg Oral Daily [name], NP   100 mcg at 06/24/19 0746  . Vitamin D3 (Vitamin D) tablet 500 Units  500 Units Oral Daily [name], MD   500 Units at 06/24/19 0746    Lab Results: No results found for this or any previous visit (from the past 97 hour(s)).  Blood Alcohol level:  Lab Results  Component Value Date   ETH <10 06/17/2019   ETH <10 0000000    Metabolic Disorder Labs: Lab Results  Component Value Date   HGBA1C 5.6 06/18/2019   MPG 114.02 06/18/2019   No results found for: PROLACTIN Lab Results  Component Value Date   CHOL 150 06/18/2019   TRIG 118 06/18/2019   HDL 45 06/18/2019   CHOLHDL 3.3 06/18/2019   VLDL 24 06/18/2019   LDLCALC 81 06/18/2019    Physical Findings: AIMS: Facial and Oral  Movements Muscles of Facial Expression: None, normal Lips and Perioral Area: None, normal Jaw: None, normal Tongue: None, normal,Extremity Movements Upper (arms, wrists, hands, fingers): None,  normal Lower (legs, knees, ankles, toes): None, normal, Trunk Movements Neck, shoulders, hips: None, normal, Overall Severity Severity of abnormal movements (highest score from questions above): None, normal Incapacitation due to abnormal movements: None, normal Patient's awareness of abnormal movements (rate only patient's report): No Awareness, Dental Status Current problems with teeth and/or dentures?: Yes Does patient usually wear dentures?: Yes  CIWA:  CIWA-Ar Total: 2 COWS:     Musculoskeletal: Strength & Muscle Tone: within normal limits Gait & Station: normal Patient leans: N/A  Psychiatric Specialty Exam: Physical Exam  Review of Systems  Blood pressure (!) 179/155, pulse (!) 108, temperature 98.8 F (37.1 C), temperature source Oral, resp. rate 20, height 5' 1.02" (1.55 m), weight 59.9 kg, SpO2 95 %.Body mass index is 24.92 kg/m.  General Appearance: Casual  Eye Contact:  Fair  Speech:  Clear and Coherent  Volume:  Decreased  Mood:  Euthymic  Affect:  Congruent  Thought Process:  Goal Directed and Descriptions of Associations: Circumstantial  Orientation:  Full (Time, Place, and Person)  Thought Content:  Denies any delusional material/has cotton in her ears she denies that she is blocking hallucinations rather states she has "an earache"  Suicidal Thoughts:  No  Homicidal Thoughts:  No  Memory:  Immediate;   Fair Remote;   Fair  Judgement:  Fair  Insight:  Fair  Psychomotor Activity:  Normal  Concentration:  Concentration: Fair and Attention Span: Fair  Recall:  [employer] of Knowledge:  Fair  Language:  Fair  Akathisia:  Negative  Handed:  Right  AIMS (if indicated):     Assets:  Leisure Time Physical Health  ADL's:  Intact  Cognition:  WNL  Sleep:  Number of  Hours: 6.25     Treatment Plan Summary: Daily contact with patient to assess and evaluate symptoms and progress in treatment and Medication management  Long-acting injectable today Continue cognitive therapy Continue reality based therapy Probable discharge tomorrow  [name], MD 06/24/2019, 11:39 AM

## 2019-06-24 NOTE — Social Work (Signed)
CSW contacted Nira Conn, [facility], to inform that patient needs a COVID test to go back to her ALF tomorrow.  Nira Conn stated that she would order it and let the pt's nurse know.

## 2019-06-24 NOTE — Progress Notes (Signed)
   06/24/19 2034  Psych Admission Type (Psych Patients Only)  Admission Status Involuntary  Psychosocial Assessment  Patient Complaints None  Eye Contact Fair  Facial Expression Flat  Affect Depressed  Speech Slow  Interaction Assertive  Motor Activity Slow  Appearance/Hygiene Unremarkable;In scrubs  Behavior Characteristics Cooperative  Mood Depressed;Anxious  Thought Process  Coherency Unable to assess (not communicating much)  Content WDL  Delusions None reported or observed  Perception WDL  Hallucination None reported or observed  Judgment Poor  Confusion None  Danger to Self  Current suicidal ideation? Denies  Danger to Others  Danger to Others None reported or observed   Pt sitting in dayroom. Forwards little. Poor eye contact. Denies SI, HI, AVH and pain. Pacing the halls.

## 2019-06-24 NOTE — Progress Notes (Signed)
Recreation Therapy Notes  Date: 2.8.21 Time: 1000 Location: 500 Hall Dayroom  Group Topic: Wellness  Goal Area(s) Addresses:  Patient will define components of whole wellness. Patient will verbalize benefit of whole wellness.  Behavioral Response: Minimal  Intervention: Exercise, Music  Activity: Exercise.  LRT led group in a series of stretches to get loosened up.  Patients then took turns leading the group in exercises of their choosing.  Patients were told to pay attention to their bodies and to take breaks if needed.  Education: Wellness, Dentist.   Education Outcome: Acknowledges education/In group clarification offered/Needs additional education.   Clinical Observations/Feedback: Pt completed most of the stretches but very little of the exercises.  Pt mainly observed and listened to the music.  Pt was appropriate throughout group session.   Victorino Sparrow, LRT/CTRS     Ria Comment, Shantinique Picazo A 06/24/2019 10:56 AM

## 2019-06-25 MED ORDER — HALOPERIDOL DECANOATE 100 MG/ML IM SOLN: 50.0000 mg | INTRAMUSCULAR | 11 refills | Status: AC

## 2019-06-25 MED ORDER — HALOPERIDOL 10 MG PO TABS: ORAL_TABLET | ORAL | 2 refills | Status: AC

## 2019-06-25 MED ORDER — VENLAFAXINE HCL ER 75 MG PO CP24: 75.0000 mg | ORAL_CAPSULE | Freq: Every day | ORAL | 2 refills | Status: AC

## 2019-06-25 MED ORDER — TEMAZEPAM 30 MG PO CAPS: 30.0000 mg | ORAL_CAPSULE | Freq: Every day | ORAL | 2 refills | Status: AC

## 2019-06-25 MED ORDER — CLONAZEPAM 0.5 MG PO TABS: 0.5000 mg | ORAL_TABLET | Freq: Two times a day (BID) | ORAL | 1 refills | Status: DC

## 2019-06-25 NOTE — Progress Notes (Signed)
Recreation Therapy Notes  Date: 2.9.21 Time: 0940 Location: 500 Hall Dayroom  Group Topic:  Goal Setting  Goal Area(s) Addresses:  Patient will identify goals they wish to set. Patient will identify benefit of setting goals. Patient will identify benefit of pursuing goals post d/c.  Intervention: Worksheet  Activity:  Lobbyist.  Patient will identify goals they wish to accomplish in week, month, year and 5 years.  Patient then identifies any obstacles to reaching goals, what they need to achieve goals and what they can start doing now to work toward goals.   Education:  Discharge Planning, Coping Skills, Goal Planning  Education Outcome: Acknowledges Education/In Group Clarification Provided/Needs Additional Education  Clinical Observations: Pt did not attend group session.     Victorino Sparrow , LRT/CTRS         Ria Comment, Hasson Gaspard A 06/25/2019 11:02 AM

## 2019-06-25 NOTE — BHH Suicide Risk Assessment (Signed)
[facility] Discharge Suicide Risk Assessment   Principal Problem: Schizoaffective disorder, bipolar type [facility]) Discharge Diagnoses: Principal Problem:   Schizoaffective disorder, bipolar type ([place])   Total Time spent with patient: 45 minutes  Musculoskeletal: Strength & Muscle Tone: within normal limits Gait & Station: normal Patient leans: N/A  Psychiatric Specialty Exam: Review of Systems  Blood pressure 112/78, pulse 93, temperature 98.2 F (36.8 C), temperature source Oral, resp. rate 20, height 5' 1.02" (1.55 m), weight 59.9 kg, SpO2 95 %.Body mass index is 24.92 kg/m.  General Appearance: Casual  Eye Contact::  Good  Speech:  Clear and Coherent409  Volume:  Decreased  Mood:  Euthymic  Affect:  Restricted  Thought Process:  Coherent, Goal Directed and Descriptions of Associations: Circumstantial  Orientation:  Full (Time, Place, and Person)  Thought Content:  No evidence and reports no auditory or visual hallucinations or thoughts of self-harm  Suicidal Thoughts:  No  Homicidal Thoughts:  No  Memory:  Immediate;   Fair Recent;   Fair Remote;   Fair  Judgement:  Fair  Insight:  Fair  Psychomotor Activity:  Normal  Concentration:  Fair  Recall:  [employer] of Knowledge:Fair  Language: Fair  Akathisia:  Negative  Handed:  Right  AIMS (if indicated):     Assets:  Housing Leisure Time Physical Health Resilience Social Support  Sleep:  Number of Hours: 5  Cognition: WNL  ADL's:  Intact   Mental Status Per Nursing Assessment::   On Admission:  NA  Demographic Factors:  Caucasian and Unemployed  Loss Factors: Decrease in vocational status  Historical Factors: Impulsivity  Risk Reduction Factors:   Sense of responsibility to family and Religious beliefs about death  Continued Clinical Symptoms:  Previous Psychiatric Diagnoses and Treatments Medical Diagnoses and Treatments/Surgeries  Cognitive Features That Contribute To Risk:  Loss of executive function     Suicide Risk:  Minimal: No identifiable suicidal ideation.  Patients presenting with no risk factors but with morbid ruminations; may be classified as minimal risk based on the severity of the depressive symptoms  Follow-up Information    [facility] Follow up.   Specialty: [place] Why: Your therapy and psychiatrist through [facility] will set up your therapy and medication management when you return within 2 weeks.  Contact information: [place] [phone]          Plan Of Care/Follow-up recommendations:  Activity:  full  Topaz Raglin, MD 06/25/2019, 9:06 AM

## 2019-06-25 NOTE — Plan of Care (Signed)
Pt was able to focus on tasks with more than to 2 prompts from LRT.   Martha Peters, LRT/CTRS

## 2019-06-25 NOTE — Progress Notes (Signed)
Patient ID: Martha Peters, female   DOB: 1965-08-25, 54 y.o.   MRN: [id number] Patient was discharged to home and transported by lyft.  Patient denies SI, HI and AVH upon discharge.  Patient acknowledged receipt of all discharge instructions.  Continue to monitor as planned. Patient able to contract for safety.

## 2019-06-25 NOTE — Progress Notes (Signed)
Recreation Therapy Notes  INPATIENT RECREATION TR PLAN  Patient Details Name: Martha Peters MRN: [id number] DOB: 09/05/1965 Today's Date: 06/25/2019  Rec Therapy Plan Is patient appropriate for Therapeutic Recreation?: Yes Treatment times per week: about 3 days Estimated Length of Stay: 5-7 days TR Treatment/Interventions: Group participation (Comment)  Discharge Criteria Pt will be discharged from therapy if:: Discharged Treatment plan/goals/alternatives discussed and agreed upon by:: Patient/family  Discharge Summary Short term goals set: See patient care plan Short term goals met: Adequate for discharge Progress toward goals comments: Groups attended Which groups?: Wellness, Other (Comment)([facility]) Reason goals not met: Pt needed more than 2 prompts Therapeutic equipment acquired: N/A Reason patient discharged from therapy: Discharge from hospital Pt/family agrees with progress & goals achieved: Yes Date patient discharged from therapy: 06/25/19     Victorino Sparrow, LRT/CTRS  Ria Comment, [place] 06/25/2019, 11:10 AM
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
## 2019-06-25 NOTE — Progress Notes (Signed)
  [facility] Adult Case Management Discharge Plan :  Will you be returning to the same living situation after discharge:  Yes,  [facility] (ALF) At discharge, do you have transportation home?: Yes,  [place]  Do you have the ability to pay for your medications: Yes,  medicare  Release of information consent forms completed and in the chart;  Patient's signature needed at discharge.  Patient to Follow up at: Follow-up Information    [facility] Follow up.   Specialty: [place] Why: Your therapy and psychiatrist through [facility] will set up your therapy and medication management when you return within 2 weeks.  Contact information: [place] [phone]          Next level of care provider has access to [place] and Suicide Prevention discussed: Yes,  pt's legal guardian/sisteer  Have you used any form of tobacco in the last 30 days? (Cigarettes, Smokeless Tobacco, Cigars, and/or Pipes): Yes  Has patient been referred to the Quitline?: Patient refused referral  Patient has been referred for addiction treatment: Yes  Trecia Rogers, LCSW 06/25/2019, 11:11 AM

## 2019-06-25 NOTE — Progress Notes (Signed)
Patient ID: Martha Peters, female   DOB: [birth year], 54 y.o.   MRN: [id number]   CSW attempted to call pt's sister/legal guardian, [name]. CSW left a voicemail regarding the patient discharging back to the ALF ([facility]).   CSW contacted [facility] and they stated that she can come back and they will pick her up at 12pm.

## 2019-06-28 DIAGNOSIS — R69 Illness, unspecified: Secondary | ICD-10-CM | POA: Diagnosis not present

## 2019-06-29 DIAGNOSIS — R69 Illness, unspecified: Secondary | ICD-10-CM | POA: Diagnosis not present

## 2019-06-30 DIAGNOSIS — R69 Illness, unspecified: Secondary | ICD-10-CM | POA: Diagnosis not present

## 2019-07-01 DIAGNOSIS — R69 Illness, unspecified: Secondary | ICD-10-CM | POA: Diagnosis not present

## 2019-07-02 DIAGNOSIS — R69 Illness, unspecified: Secondary | ICD-10-CM | POA: Diagnosis not present

## 2019-07-03 DIAGNOSIS — R69 Illness, unspecified: Secondary | ICD-10-CM | POA: Diagnosis not present

## 2019-07-04 DIAGNOSIS — Z79899 Other long term (current) drug therapy: Secondary | ICD-10-CM | POA: Diagnosis not present

## 2019-07-04 DIAGNOSIS — R69 Illness, unspecified: Secondary | ICD-10-CM | POA: Diagnosis not present

## 2019-07-05 DIAGNOSIS — R69 Illness, unspecified: Secondary | ICD-10-CM | POA: Diagnosis not present

## 2019-07-06 DIAGNOSIS — R69 Illness, unspecified: Secondary | ICD-10-CM | POA: Diagnosis not present

## 2019-07-07 DIAGNOSIS — R69 Illness, unspecified: Secondary | ICD-10-CM | POA: Diagnosis not present

## 2019-07-09 DIAGNOSIS — R69 Illness, unspecified: Secondary | ICD-10-CM | POA: Diagnosis not present

## 2019-07-10 DIAGNOSIS — R69 Illness, unspecified: Secondary | ICD-10-CM | POA: Diagnosis not present

## 2019-07-11 DIAGNOSIS — R69 Illness, unspecified: Secondary | ICD-10-CM | POA: Diagnosis not present

## 2019-07-12 DIAGNOSIS — R69 Illness, unspecified: Secondary | ICD-10-CM | POA: Diagnosis not present

## 2019-07-13 DIAGNOSIS — R69 Illness, unspecified: Secondary | ICD-10-CM | POA: Diagnosis not present

## 2019-07-14 DIAGNOSIS — R69 Illness, unspecified: Secondary | ICD-10-CM | POA: Diagnosis not present

## 2019-07-15 DIAGNOSIS — R69 Illness, unspecified: Secondary | ICD-10-CM | POA: Diagnosis not present

## 2019-07-16 DIAGNOSIS — R69 Illness, unspecified: Secondary | ICD-10-CM | POA: Diagnosis not present

## 2019-07-17 DIAGNOSIS — R69 Illness, unspecified: Secondary | ICD-10-CM | POA: Diagnosis not present

## 2019-07-18 DIAGNOSIS — R69 Illness, unspecified: Secondary | ICD-10-CM | POA: Diagnosis not present

## 2019-07-19 DIAGNOSIS — R69 Illness, unspecified: Secondary | ICD-10-CM | POA: Diagnosis not present

## 2019-07-20 DIAGNOSIS — R69 Illness, unspecified: Secondary | ICD-10-CM | POA: Diagnosis not present

## 2019-07-21 DIAGNOSIS — R69 Illness, unspecified: Secondary | ICD-10-CM | POA: Diagnosis not present

## 2019-07-22 DIAGNOSIS — R69 Illness, unspecified: Secondary | ICD-10-CM | POA: Diagnosis not present

## 2019-07-23 DIAGNOSIS — R69 Illness, unspecified: Secondary | ICD-10-CM | POA: Diagnosis not present

## 2019-07-24 DIAGNOSIS — R69 Illness, unspecified: Secondary | ICD-10-CM | POA: Diagnosis not present

## 2019-07-25 DIAGNOSIS — R69 Illness, unspecified: Secondary | ICD-10-CM | POA: Diagnosis not present

## 2019-07-26 DIAGNOSIS — R69 Illness, unspecified: Secondary | ICD-10-CM | POA: Diagnosis not present

## 2019-07-27 DIAGNOSIS — R69 Illness, unspecified: Secondary | ICD-10-CM | POA: Diagnosis not present

## 2019-07-28 DIAGNOSIS — R69 Illness, unspecified: Secondary | ICD-10-CM | POA: Diagnosis not present

## 2019-07-29 DIAGNOSIS — R69 Illness, unspecified: Secondary | ICD-10-CM | POA: Diagnosis not present

## 2019-07-30 DIAGNOSIS — R69 Illness, unspecified: Secondary | ICD-10-CM | POA: Diagnosis not present

## 2019-07-31 DIAGNOSIS — R69 Illness, unspecified: Secondary | ICD-10-CM | POA: Diagnosis not present

## 2019-08-01 DIAGNOSIS — R69 Illness, unspecified: Secondary | ICD-10-CM | POA: Diagnosis not present

## 2019-08-03 DIAGNOSIS — R69 Illness, unspecified: Secondary | ICD-10-CM | POA: Diagnosis not present

## 2019-08-04 DIAGNOSIS — R69 Illness, unspecified: Secondary | ICD-10-CM | POA: Diagnosis not present

## 2019-08-05 DIAGNOSIS — R69 Illness, unspecified: Secondary | ICD-10-CM | POA: Diagnosis not present

## 2019-08-06 DIAGNOSIS — R69 Illness, unspecified: Secondary | ICD-10-CM | POA: Diagnosis not present

## 2019-08-07 DIAGNOSIS — R69 Illness, unspecified: Secondary | ICD-10-CM | POA: Diagnosis not present

## 2019-08-08 DIAGNOSIS — R69 Illness, unspecified: Secondary | ICD-10-CM | POA: Diagnosis not present

## 2019-08-09 DIAGNOSIS — R69 Illness, unspecified: Secondary | ICD-10-CM | POA: Diagnosis not present

## 2019-08-10 DIAGNOSIS — R69 Illness, unspecified: Secondary | ICD-10-CM | POA: Diagnosis not present

## 2019-08-11 DIAGNOSIS — R69 Illness, unspecified: Secondary | ICD-10-CM | POA: Diagnosis not present

## 2019-08-15 DIAGNOSIS — R69 Illness, unspecified: Secondary | ICD-10-CM | POA: Diagnosis not present

## 2019-08-16 DIAGNOSIS — R69 Illness, unspecified: Secondary | ICD-10-CM | POA: Diagnosis not present

## 2019-08-17 DIAGNOSIS — R69 Illness, unspecified: Secondary | ICD-10-CM | POA: Diagnosis not present

## 2019-08-18 DIAGNOSIS — R69 Illness, unspecified: Secondary | ICD-10-CM | POA: Diagnosis not present

## 2019-08-19 DIAGNOSIS — R69 Illness, unspecified: Secondary | ICD-10-CM | POA: Diagnosis not present

## 2019-08-20 DIAGNOSIS — R69 Illness, unspecified: Secondary | ICD-10-CM | POA: Diagnosis not present

## 2019-08-21 DIAGNOSIS — R69 Illness, unspecified: Secondary | ICD-10-CM | POA: Diagnosis not present

## 2019-08-22 DIAGNOSIS — R69 Illness, unspecified: Secondary | ICD-10-CM | POA: Diagnosis not present

## 2019-08-23 DIAGNOSIS — R69 Illness, unspecified: Secondary | ICD-10-CM | POA: Diagnosis not present

## 2019-08-24 DIAGNOSIS — R69 Illness, unspecified: Secondary | ICD-10-CM | POA: Diagnosis not present

## 2019-08-25 DIAGNOSIS — R69 Illness, unspecified: Secondary | ICD-10-CM | POA: Diagnosis not present

## 2019-08-26 DIAGNOSIS — R69 Illness, unspecified: Secondary | ICD-10-CM | POA: Diagnosis not present

## 2019-08-27 DIAGNOSIS — R69 Illness, unspecified: Secondary | ICD-10-CM | POA: Diagnosis not present

## 2019-08-28 DIAGNOSIS — R69 Illness, unspecified: Secondary | ICD-10-CM | POA: Diagnosis not present

## 2019-08-29 DIAGNOSIS — R69 Illness, unspecified: Secondary | ICD-10-CM | POA: Diagnosis not present

## 2019-08-30 DIAGNOSIS — R69 Illness, unspecified: Secondary | ICD-10-CM | POA: Diagnosis not present

## 2019-08-31 DIAGNOSIS — R69 Illness, unspecified: Secondary | ICD-10-CM | POA: Diagnosis not present

## 2019-09-01 DIAGNOSIS — R69 Illness, unspecified: Secondary | ICD-10-CM | POA: Diagnosis not present

## 2019-09-02 DIAGNOSIS — R69 Illness, unspecified: Secondary | ICD-10-CM | POA: Diagnosis not present

## 2019-09-03 DIAGNOSIS — R69 Illness, unspecified: Secondary | ICD-10-CM | POA: Diagnosis not present

## 2019-09-04 DIAGNOSIS — R69 Illness, unspecified: Secondary | ICD-10-CM | POA: Diagnosis not present

## 2019-09-05 DIAGNOSIS — R69 Illness, unspecified: Secondary | ICD-10-CM | POA: Diagnosis not present

## 2019-09-06 DIAGNOSIS — R69 Illness, unspecified: Secondary | ICD-10-CM | POA: Diagnosis not present

## 2019-09-07 ENCOUNTER — Other Ambulatory Visit: Payer: Self-pay

## 2019-09-07 ENCOUNTER — Encounter (HOSPITAL_BASED_OUTPATIENT_CLINIC_OR_DEPARTMENT_OTHER): Payer: Self-pay | Admitting: Emergency Medicine

## 2019-09-07 ENCOUNTER — Emergency Department (HOSPITAL_BASED_OUTPATIENT_CLINIC_OR_DEPARTMENT_OTHER)
Admission: EM | Admit: 2019-09-07 | Discharge: 2019-09-07 | Disposition: A | Discharge status: Skilled Nursing Facility | Payer: Medicare HMO | Attending: Emergency Medicine | Admitting: Emergency Medicine

## 2019-09-07 DIAGNOSIS — F17210 Nicotine dependence, cigarettes, uncomplicated: Secondary | ICD-10-CM | POA: Insufficient documentation

## 2019-09-07 DIAGNOSIS — Z743 Need for continuous supervision: Secondary | ICD-10-CM | POA: Diagnosis not present

## 2019-09-07 DIAGNOSIS — E039 Hypothyroidism, unspecified: Secondary | ICD-10-CM | POA: Diagnosis not present

## 2019-09-07 DIAGNOSIS — R69 Illness, unspecified: Secondary | ICD-10-CM | POA: Diagnosis not present

## 2019-09-07 DIAGNOSIS — R5381 Other malaise: Secondary | ICD-10-CM | POA: Diagnosis not present

## 2019-09-07 DIAGNOSIS — R404 Transient alteration of awareness: Secondary | ICD-10-CM | POA: Diagnosis not present

## 2019-09-07 DIAGNOSIS — R410 Disorientation, unspecified: Secondary | ICD-10-CM | POA: Diagnosis not present

## 2019-09-07 DIAGNOSIS — R002 Palpitations: Secondary | ICD-10-CM | POA: Diagnosis not present

## 2019-09-07 DIAGNOSIS — Z79899 Other long term (current) drug therapy: Secondary | ICD-10-CM | POA: Insufficient documentation

## 2019-09-07 DIAGNOSIS — R279 Unspecified lack of coordination: Secondary | ICD-10-CM | POA: Diagnosis not present

## 2019-09-07 DIAGNOSIS — R9431 Abnormal electrocardiogram [ECG] [EKG]: Secondary | ICD-10-CM | POA: Diagnosis not present

## 2019-09-07 HISTORY — DX: Bipolar disorder, unspecified: F31.9

## 2019-09-07 HISTORY — DX: Disorder of thyroid, unspecified: E07.9

## 2019-09-07 LAB — CBC WITH DIFFERENTIAL/PLATELET
Abs Immature Granulocytes: 0.01 10*3/uL (ref 0.00–0.07)
Basophils Absolute: 0.1 10*3/uL (ref 0.0–0.1)
Basophils Relative: 1 %
Eosinophils Absolute: 0.2 10*3/uL (ref 0.0–0.5)
Eosinophils Relative: 2 %
HCT: 44.9 % (ref 36.0–46.0)
Hemoglobin: 15 g/dL (ref 12.0–15.0)
Immature Granulocytes: 0 %
Lymphocytes Relative: 40 %
Lymphs Abs: 3.4 10*3/uL (ref 0.7–4.0)
MCH: 29.4 pg (ref 26.0–34.0)
MCHC: 33.4 g/dL (ref 30.0–36.0)
MCV: 87.9 fL (ref 80.0–100.0)
Monocytes Absolute: 0.5 10*3/uL (ref 0.1–1.0)
Monocytes Relative: 6 %
Neutro Abs: 4.5 10*3/uL (ref 1.7–7.7)
Neutrophils Relative %: 51 %
Platelets: 185 10*3/uL (ref 150–400)
RBC: 5.11 MIL/uL (ref 3.87–5.11)
RDW: 12.7 % (ref 11.5–15.5)
WBC: 8.6 10*3/uL (ref 4.0–10.5)
nRBC: 0 % (ref 0.0–0.2)

## 2019-09-07 LAB — BASIC METABOLIC PANEL
Anion gap: 10 (ref 5–15)
BUN: 14 mg/dL (ref 6–20)
CO2: 31 mmol/L (ref 22–32)
Calcium: 8.8 mg/dL — ABNORMAL LOW (ref 8.9–10.3)
Chloride: 104 mmol/L (ref 98–111)
Creatinine, Ser: 0.5 mg/dL (ref 0.44–1.00)
GFR calc Af Amer: >60 mL/min (ref >60)
GFR calc non Af Amer: >60 mL/min (ref >60)
Glucose, Bld: 125 mg/dL — ABNORMAL HIGH (ref 70–99)
Potassium: 4.2 mmol/L (ref 3.5–5.1)
Sodium: 145 mmol/L (ref 135–145)
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
## 2019-09-07 NOTE — ED Triage Notes (Signed)
Pt arrives via EMS from [facility] for palpitations -  EMS specifically states that the patient called 911 and not the staff at the facility. Pt alert to situation, but confused about name and birthday. EMS reports this is normal. EMS reports calling [facility] multiple times with no answer.

## 2019-09-07 NOTE — ED Notes (Signed)
Contacted [facility] for transport back to patient's residence @ [facility] @ [address] [place], [place]

## 2019-09-08 DIAGNOSIS — R69 Illness, unspecified: Secondary | ICD-10-CM | POA: Diagnosis not present

## 2019-09-09 DIAGNOSIS — R69 Illness, unspecified: Secondary | ICD-10-CM | POA: Diagnosis not present

## 2019-09-10 DIAGNOSIS — R69 Illness, unspecified: Secondary | ICD-10-CM | POA: Diagnosis not present

## 2019-09-14 DIAGNOSIS — R69 Illness, unspecified: Secondary | ICD-10-CM | POA: Diagnosis not present

## 2019-09-15 DIAGNOSIS — R69 Illness, unspecified: Secondary | ICD-10-CM | POA: Diagnosis not present

## 2019-09-16 DIAGNOSIS — R69 Illness, unspecified: Secondary | ICD-10-CM | POA: Diagnosis not present

## 2019-09-17 DIAGNOSIS — R69 Illness, unspecified: Secondary | ICD-10-CM | POA: Diagnosis not present

## 2019-09-18 DIAGNOSIS — R69 Illness, unspecified: Secondary | ICD-10-CM | POA: Diagnosis not present

## 2019-09-19 DIAGNOSIS — R69 Illness, unspecified: Secondary | ICD-10-CM | POA: Diagnosis not present

## 2019-09-20 DIAGNOSIS — R69 Illness, unspecified: Secondary | ICD-10-CM | POA: Diagnosis not present

## 2019-09-21 DIAGNOSIS — R69 Illness, unspecified: Secondary | ICD-10-CM | POA: Diagnosis not present

## 2019-09-22 DIAGNOSIS — R69 Illness, unspecified: Secondary | ICD-10-CM | POA: Diagnosis not present

## 2019-09-23 DIAGNOSIS — R69 Illness, unspecified: Secondary | ICD-10-CM | POA: Diagnosis not present

## 2019-09-24 DIAGNOSIS — R69 Illness, unspecified: Secondary | ICD-10-CM | POA: Diagnosis not present

## 2019-09-25 DIAGNOSIS — E559 Vitamin D deficiency, unspecified: Secondary | ICD-10-CM | POA: Diagnosis not present

## 2019-09-25 DIAGNOSIS — E538 Deficiency of other specified B group vitamins: Secondary | ICD-10-CM | POA: Diagnosis not present

## 2019-09-25 DIAGNOSIS — G40909 Epilepsy, unspecified, not intractable, without status epilepticus: Secondary | ICD-10-CM | POA: Diagnosis not present

## 2019-09-25 DIAGNOSIS — E876 Hypokalemia: Secondary | ICD-10-CM | POA: Diagnosis not present

## 2019-09-25 DIAGNOSIS — E039 Hypothyroidism, unspecified: Secondary | ICD-10-CM | POA: Diagnosis not present

## 2019-09-25 DIAGNOSIS — R69 Illness, unspecified: Secondary | ICD-10-CM | POA: Diagnosis not present

## 2019-09-26 DIAGNOSIS — R69 Illness, unspecified: Secondary | ICD-10-CM | POA: Diagnosis not present

## 2019-09-27 DIAGNOSIS — R69 Illness, unspecified: Secondary | ICD-10-CM | POA: Diagnosis not present

## 2019-09-28 DIAGNOSIS — R69 Illness, unspecified: Secondary | ICD-10-CM | POA: Diagnosis not present

## 2019-09-29 DIAGNOSIS — R69 Illness, unspecified: Secondary | ICD-10-CM | POA: Diagnosis not present

## 2019-09-30 DIAGNOSIS — R69 Illness, unspecified: Secondary | ICD-10-CM | POA: Diagnosis not present

## 2019-10-01 DIAGNOSIS — R69 Illness, unspecified: Secondary | ICD-10-CM | POA: Diagnosis not present

## 2019-10-02 DIAGNOSIS — R69 Illness, unspecified: Secondary | ICD-10-CM | POA: Diagnosis not present

## 2019-10-03 DIAGNOSIS — R69 Illness, unspecified: Secondary | ICD-10-CM | POA: Diagnosis not present

## 2019-10-04 DIAGNOSIS — R69 Illness, unspecified: Secondary | ICD-10-CM | POA: Diagnosis not present

## 2019-10-05 DIAGNOSIS — R69 Illness, unspecified: Secondary | ICD-10-CM | POA: Diagnosis not present

## 2019-10-06 DIAGNOSIS — R69 Illness, unspecified: Secondary | ICD-10-CM | POA: Diagnosis not present

## 2019-10-07 DIAGNOSIS — R69 Illness, unspecified: Secondary | ICD-10-CM | POA: Diagnosis not present

## 2019-10-08 DIAGNOSIS — R69 Illness, unspecified: Secondary | ICD-10-CM | POA: Diagnosis not present

## 2019-10-09 DIAGNOSIS — R69 Illness, unspecified: Secondary | ICD-10-CM | POA: Diagnosis not present

## 2019-10-10 DIAGNOSIS — H2513 Age-related nuclear cataract, bilateral: Secondary | ICD-10-CM | POA: Diagnosis not present

## 2019-10-10 DIAGNOSIS — R69 Illness, unspecified: Secondary | ICD-10-CM | POA: Diagnosis not present

## 2019-10-15 DIAGNOSIS — R69 Illness, unspecified: Secondary | ICD-10-CM | POA: Diagnosis not present

## 2019-10-16 DIAGNOSIS — R69 Illness, unspecified: Secondary | ICD-10-CM | POA: Diagnosis not present

## 2019-10-17 DIAGNOSIS — R69 Illness, unspecified: Secondary | ICD-10-CM | POA: Diagnosis not present

## 2019-10-18 DIAGNOSIS — R69 Illness, unspecified: Secondary | ICD-10-CM | POA: Diagnosis not present

## 2019-10-18 DIAGNOSIS — Z79899 Other long term (current) drug therapy: Secondary | ICD-10-CM | POA: Diagnosis not present

## 2019-10-19 DIAGNOSIS — R69 Illness, unspecified: Secondary | ICD-10-CM | POA: Diagnosis not present

## 2019-10-20 DIAGNOSIS — R69 Illness, unspecified: Secondary | ICD-10-CM | POA: Diagnosis not present

## 2019-10-21 DIAGNOSIS — R69 Illness, unspecified: Secondary | ICD-10-CM | POA: Diagnosis not present

## 2019-10-22 DIAGNOSIS — R69 Illness, unspecified: Secondary | ICD-10-CM | POA: Diagnosis not present

## 2019-10-22 DIAGNOSIS — Z79899 Other long term (current) drug therapy: Secondary | ICD-10-CM | POA: Diagnosis not present

## 2019-10-23 DIAGNOSIS — E876 Hypokalemia: Secondary | ICD-10-CM | POA: Diagnosis not present

## 2019-10-23 DIAGNOSIS — E039 Hypothyroidism, unspecified: Secondary | ICD-10-CM | POA: Diagnosis not present

## 2019-10-23 DIAGNOSIS — R69 Illness, unspecified: Secondary | ICD-10-CM | POA: Diagnosis not present

## 2019-10-23 DIAGNOSIS — G40909 Epilepsy, unspecified, not intractable, without status epilepticus: Secondary | ICD-10-CM | POA: Diagnosis not present

## 2019-10-23 DIAGNOSIS — E538 Deficiency of other specified B group vitamins: Secondary | ICD-10-CM | POA: Diagnosis not present

## 2019-10-23 DIAGNOSIS — E559 Vitamin D deficiency, unspecified: Secondary | ICD-10-CM | POA: Diagnosis not present

## 2019-10-24 DIAGNOSIS — R69 Illness, unspecified: Secondary | ICD-10-CM | POA: Diagnosis not present

## 2019-10-25 DIAGNOSIS — R69 Illness, unspecified: Secondary | ICD-10-CM | POA: Diagnosis not present

## 2019-10-26 DIAGNOSIS — R69 Illness, unspecified: Secondary | ICD-10-CM | POA: Diagnosis not present

## 2019-10-27 DIAGNOSIS — R69 Illness, unspecified: Secondary | ICD-10-CM | POA: Diagnosis not present

## 2019-10-28 DIAGNOSIS — R69 Illness, unspecified: Secondary | ICD-10-CM | POA: Diagnosis not present

## 2019-10-29 DIAGNOSIS — R69 Illness, unspecified: Secondary | ICD-10-CM | POA: Diagnosis not present

## 2019-10-30 DIAGNOSIS — R69 Illness, unspecified: Secondary | ICD-10-CM | POA: Diagnosis not present

## 2019-10-31 DIAGNOSIS — R69 Illness, unspecified: Secondary | ICD-10-CM | POA: Diagnosis not present

## 2019-11-01 DIAGNOSIS — R69 Illness, unspecified: Secondary | ICD-10-CM | POA: Diagnosis not present

## 2019-11-02 DIAGNOSIS — R69 Illness, unspecified: Secondary | ICD-10-CM | POA: Diagnosis not present

## 2019-11-03 DIAGNOSIS — R69 Illness, unspecified: Secondary | ICD-10-CM | POA: Diagnosis not present

## 2019-11-04 DIAGNOSIS — R69 Illness, unspecified: Secondary | ICD-10-CM | POA: Diagnosis not present

## 2019-11-05 DIAGNOSIS — R69 Illness, unspecified: Secondary | ICD-10-CM | POA: Diagnosis not present

## 2019-11-06 DIAGNOSIS — R69 Illness, unspecified: Secondary | ICD-10-CM | POA: Diagnosis not present

## 2019-11-07 DIAGNOSIS — R69 Illness, unspecified: Secondary | ICD-10-CM | POA: Diagnosis not present

## 2019-11-08 DIAGNOSIS — R69 Illness, unspecified: Secondary | ICD-10-CM | POA: Diagnosis not present

## 2019-11-09 DIAGNOSIS — R69 Illness, unspecified: Secondary | ICD-10-CM | POA: Diagnosis not present

## 2019-11-10 DIAGNOSIS — R69 Illness, unspecified: Secondary | ICD-10-CM | POA: Diagnosis not present

## 2019-11-14 DIAGNOSIS — R69 Illness, unspecified: Secondary | ICD-10-CM | POA: Diagnosis not present

## 2019-11-15 DIAGNOSIS — R69 Illness, unspecified: Secondary | ICD-10-CM | POA: Diagnosis not present

## 2019-11-16 DIAGNOSIS — R69 Illness, unspecified: Secondary | ICD-10-CM | POA: Diagnosis not present

## 2019-11-17 DIAGNOSIS — R69 Illness, unspecified: Secondary | ICD-10-CM | POA: Diagnosis not present

## 2019-11-18 DIAGNOSIS — R69 Illness, unspecified: Secondary | ICD-10-CM | POA: Diagnosis not present

## 2019-11-19 DIAGNOSIS — R69 Illness, unspecified: Secondary | ICD-10-CM | POA: Diagnosis not present

## 2019-11-20 DIAGNOSIS — R69 Illness, unspecified: Secondary | ICD-10-CM | POA: Diagnosis not present

## 2019-11-21 DIAGNOSIS — E876 Hypokalemia: Secondary | ICD-10-CM | POA: Diagnosis not present

## 2019-11-21 DIAGNOSIS — G40909 Epilepsy, unspecified, not intractable, without status epilepticus: Secondary | ICD-10-CM | POA: Diagnosis not present

## 2019-11-21 DIAGNOSIS — E039 Hypothyroidism, unspecified: Secondary | ICD-10-CM | POA: Diagnosis not present

## 2019-11-21 DIAGNOSIS — R69 Illness, unspecified: Secondary | ICD-10-CM | POA: Diagnosis not present

## 2019-11-21 DIAGNOSIS — E559 Vitamin D deficiency, unspecified: Secondary | ICD-10-CM | POA: Diagnosis not present

## 2019-11-22 DIAGNOSIS — Z79899 Other long term (current) drug therapy: Secondary | ICD-10-CM | POA: Diagnosis not present

## 2019-11-22 DIAGNOSIS — R69 Illness, unspecified: Secondary | ICD-10-CM | POA: Diagnosis not present

## 2019-11-23 DIAGNOSIS — R69 Illness, unspecified: Secondary | ICD-10-CM | POA: Diagnosis not present

## 2019-11-24 DIAGNOSIS — R69 Illness, unspecified: Secondary | ICD-10-CM | POA: Diagnosis not present

## 2019-11-25 DIAGNOSIS — R69 Illness, unspecified: Secondary | ICD-10-CM | POA: Diagnosis not present

## 2019-11-26 DIAGNOSIS — R69 Illness, unspecified: Secondary | ICD-10-CM | POA: Diagnosis not present

## 2019-11-27 DIAGNOSIS — R69 Illness, unspecified: Secondary | ICD-10-CM | POA: Diagnosis not present

## 2019-11-28 DIAGNOSIS — R69 Illness, unspecified: Secondary | ICD-10-CM | POA: Diagnosis not present

## 2019-11-29 DIAGNOSIS — R69 Illness, unspecified: Secondary | ICD-10-CM | POA: Diagnosis not present

## 2019-11-30 DIAGNOSIS — R69 Illness, unspecified: Secondary | ICD-10-CM | POA: Diagnosis not present

## 2019-12-01 DIAGNOSIS — R69 Illness, unspecified: Secondary | ICD-10-CM | POA: Diagnosis not present

## 2019-12-02 DIAGNOSIS — R69 Illness, unspecified: Secondary | ICD-10-CM | POA: Diagnosis not present

## 2019-12-03 DIAGNOSIS — R69 Illness, unspecified: Secondary | ICD-10-CM | POA: Diagnosis not present

## 2019-12-04 DIAGNOSIS — R69 Illness, unspecified: Secondary | ICD-10-CM | POA: Diagnosis not present

## 2019-12-05 DIAGNOSIS — R69 Illness, unspecified: Secondary | ICD-10-CM | POA: Diagnosis not present

## 2019-12-06 DIAGNOSIS — R69 Illness, unspecified: Secondary | ICD-10-CM | POA: Diagnosis not present

## 2019-12-07 DIAGNOSIS — R69 Illness, unspecified: Secondary | ICD-10-CM | POA: Diagnosis not present

## 2019-12-08 DIAGNOSIS — R69 Illness, unspecified: Secondary | ICD-10-CM | POA: Diagnosis not present

## 2019-12-09 DIAGNOSIS — R69 Illness, unspecified: Secondary | ICD-10-CM | POA: Diagnosis not present

## 2019-12-10 DIAGNOSIS — R69 Illness, unspecified: Secondary | ICD-10-CM | POA: Diagnosis not present

## 2019-12-15 DIAGNOSIS — R69 Illness, unspecified: Secondary | ICD-10-CM | POA: Diagnosis not present

## 2019-12-16 DIAGNOSIS — R69 Illness, unspecified: Secondary | ICD-10-CM | POA: Diagnosis not present

## 2019-12-17 DIAGNOSIS — R69 Illness, unspecified: Secondary | ICD-10-CM | POA: Diagnosis not present

## 2019-12-18 DIAGNOSIS — R69 Illness, unspecified: Secondary | ICD-10-CM | POA: Diagnosis not present

## 2019-12-19 DIAGNOSIS — R69 Illness, unspecified: Secondary | ICD-10-CM | POA: Diagnosis not present

## 2019-12-20 DIAGNOSIS — R69 Illness, unspecified: Secondary | ICD-10-CM | POA: Diagnosis not present

## 2019-12-21 DIAGNOSIS — R69 Illness, unspecified: Secondary | ICD-10-CM | POA: Diagnosis not present

## 2019-12-22 DIAGNOSIS — R69 Illness, unspecified: Secondary | ICD-10-CM | POA: Diagnosis not present

## 2019-12-23 DIAGNOSIS — R69 Illness, unspecified: Secondary | ICD-10-CM | POA: Diagnosis not present

## 2019-12-24 DIAGNOSIS — R69 Illness, unspecified: Secondary | ICD-10-CM | POA: Diagnosis not present

## 2019-12-25 DIAGNOSIS — R69 Illness, unspecified: Secondary | ICD-10-CM | POA: Diagnosis not present

## 2019-12-26 DIAGNOSIS — R69 Illness, unspecified: Secondary | ICD-10-CM | POA: Diagnosis not present

## 2019-12-27 DIAGNOSIS — R69 Illness, unspecified: Secondary | ICD-10-CM | POA: Diagnosis not present

## 2019-12-28 DIAGNOSIS — R69 Illness, unspecified: Secondary | ICD-10-CM | POA: Diagnosis not present

## 2019-12-29 DIAGNOSIS — R69 Illness, unspecified: Secondary | ICD-10-CM | POA: Diagnosis not present

## 2019-12-30 DIAGNOSIS — R69 Illness, unspecified: Secondary | ICD-10-CM | POA: Diagnosis not present

## 2019-12-31 DIAGNOSIS — R69 Illness, unspecified: Secondary | ICD-10-CM | POA: Diagnosis not present

## 2020-01-01 DIAGNOSIS — R69 Illness, unspecified: Secondary | ICD-10-CM | POA: Diagnosis not present

## 2020-01-01 DIAGNOSIS — E559 Vitamin D deficiency, unspecified: Secondary | ICD-10-CM | POA: Diagnosis not present

## 2020-01-01 DIAGNOSIS — E538 Deficiency of other specified B group vitamins: Secondary | ICD-10-CM | POA: Diagnosis not present

## 2020-01-01 DIAGNOSIS — E876 Hypokalemia: Secondary | ICD-10-CM | POA: Diagnosis not present

## 2020-01-01 DIAGNOSIS — G40909 Epilepsy, unspecified, not intractable, without status epilepticus: Secondary | ICD-10-CM | POA: Diagnosis not present

## 2020-01-01 DIAGNOSIS — E039 Hypothyroidism, unspecified: Secondary | ICD-10-CM | POA: Diagnosis not present

## 2020-01-02 DIAGNOSIS — R69 Illness, unspecified: Secondary | ICD-10-CM | POA: Diagnosis not present

## 2020-01-03 DIAGNOSIS — R69 Illness, unspecified: Secondary | ICD-10-CM | POA: Diagnosis not present

## 2020-01-04 DIAGNOSIS — R69 Illness, unspecified: Secondary | ICD-10-CM | POA: Diagnosis not present

## 2020-01-05 DIAGNOSIS — R69 Illness, unspecified: Secondary | ICD-10-CM | POA: Diagnosis not present

## 2020-01-06 DIAGNOSIS — R69 Illness, unspecified: Secondary | ICD-10-CM | POA: Diagnosis not present

## 2020-01-07 DIAGNOSIS — R69 Illness, unspecified: Secondary | ICD-10-CM | POA: Diagnosis not present

## 2020-01-08 DIAGNOSIS — R69 Illness, unspecified: Secondary | ICD-10-CM | POA: Diagnosis not present

## 2020-01-09 DIAGNOSIS — R69 Illness, unspecified: Secondary | ICD-10-CM | POA: Diagnosis not present

## 2020-01-10 DIAGNOSIS — R69 Illness, unspecified: Secondary | ICD-10-CM | POA: Diagnosis not present

## 2020-01-15 DIAGNOSIS — R69 Illness, unspecified: Secondary | ICD-10-CM | POA: Diagnosis not present

## 2020-01-16 DIAGNOSIS — R69 Illness, unspecified: Secondary | ICD-10-CM | POA: Diagnosis not present

## 2020-01-17 DIAGNOSIS — R69 Illness, unspecified: Secondary | ICD-10-CM | POA: Diagnosis not present

## 2020-01-17 DIAGNOSIS — Z79899 Other long term (current) drug therapy: Secondary | ICD-10-CM | POA: Diagnosis not present

## 2020-01-18 DIAGNOSIS — R69 Illness, unspecified: Secondary | ICD-10-CM | POA: Diagnosis not present

## 2020-01-19 DIAGNOSIS — R69 Illness, unspecified: Secondary | ICD-10-CM | POA: Diagnosis not present

## 2020-01-20 DIAGNOSIS — R69 Illness, unspecified: Secondary | ICD-10-CM | POA: Diagnosis not present

## 2020-01-21 DIAGNOSIS — R69 Illness, unspecified: Secondary | ICD-10-CM | POA: Diagnosis not present

## 2020-01-22 DIAGNOSIS — R69 Illness, unspecified: Secondary | ICD-10-CM | POA: Diagnosis not present

## 2020-01-23 DIAGNOSIS — R69 Illness, unspecified: Secondary | ICD-10-CM | POA: Diagnosis not present

## 2020-01-24 DIAGNOSIS — R69 Illness, unspecified: Secondary | ICD-10-CM | POA: Diagnosis not present

## 2020-01-25 DIAGNOSIS — R69 Illness, unspecified: Secondary | ICD-10-CM | POA: Diagnosis not present

## 2020-01-26 DIAGNOSIS — R69 Illness, unspecified: Secondary | ICD-10-CM | POA: Diagnosis not present

## 2020-01-27 DIAGNOSIS — R69 Illness, unspecified: Secondary | ICD-10-CM | POA: Diagnosis not present

## 2020-01-28 DIAGNOSIS — R69 Illness, unspecified: Secondary | ICD-10-CM | POA: Diagnosis not present

## 2020-01-29 DIAGNOSIS — R69 Illness, unspecified: Secondary | ICD-10-CM | POA: Diagnosis not present

## 2020-01-30 DIAGNOSIS — R69 Illness, unspecified: Secondary | ICD-10-CM | POA: Diagnosis not present

## 2020-01-31 DIAGNOSIS — R69 Illness, unspecified: Secondary | ICD-10-CM | POA: Diagnosis not present

## 2020-02-01 DIAGNOSIS — R69 Illness, unspecified: Secondary | ICD-10-CM | POA: Diagnosis not present

## 2020-02-02 DIAGNOSIS — R69 Illness, unspecified: Secondary | ICD-10-CM | POA: Diagnosis not present

## 2020-02-03 DIAGNOSIS — R69 Illness, unspecified: Secondary | ICD-10-CM | POA: Diagnosis not present

## 2020-02-04 DIAGNOSIS — R69 Illness, unspecified: Secondary | ICD-10-CM | POA: Diagnosis not present

## 2020-02-05 DIAGNOSIS — R69 Illness, unspecified: Secondary | ICD-10-CM | POA: Diagnosis not present

## 2020-02-06 DIAGNOSIS — R69 Illness, unspecified: Secondary | ICD-10-CM | POA: Diagnosis not present

## 2020-02-06 DIAGNOSIS — E559 Vitamin D deficiency, unspecified: Secondary | ICD-10-CM | POA: Diagnosis not present

## 2020-02-06 DIAGNOSIS — E538 Deficiency of other specified B group vitamins: Secondary | ICD-10-CM | POA: Diagnosis not present

## 2020-02-06 DIAGNOSIS — E039 Hypothyroidism, unspecified: Secondary | ICD-10-CM | POA: Diagnosis not present

## 2020-02-06 DIAGNOSIS — G40909 Epilepsy, unspecified, not intractable, without status epilepticus: Secondary | ICD-10-CM | POA: Diagnosis not present

## 2020-02-06 DIAGNOSIS — E876 Hypokalemia: Secondary | ICD-10-CM | POA: Diagnosis not present

## 2020-02-07 DIAGNOSIS — R69 Illness, unspecified: Secondary | ICD-10-CM | POA: Diagnosis not present

## 2020-02-08 DIAGNOSIS — R69 Illness, unspecified: Secondary | ICD-10-CM | POA: Diagnosis not present

## 2020-02-09 DIAGNOSIS — R69 Illness, unspecified: Secondary | ICD-10-CM | POA: Diagnosis not present

## 2020-02-10 DIAGNOSIS — R69 Illness, unspecified: Secondary | ICD-10-CM | POA: Diagnosis not present

## 2020-02-14 DIAGNOSIS — R69 Illness, unspecified: Secondary | ICD-10-CM | POA: Diagnosis not present

## 2020-02-15 DIAGNOSIS — R69 Illness, unspecified: Secondary | ICD-10-CM | POA: Diagnosis not present

## 2020-02-16 DIAGNOSIS — R69 Illness, unspecified: Secondary | ICD-10-CM | POA: Diagnosis not present

## 2020-02-17 DIAGNOSIS — R69 Illness, unspecified: Secondary | ICD-10-CM | POA: Diagnosis not present

## 2020-02-18 DIAGNOSIS — R69 Illness, unspecified: Secondary | ICD-10-CM | POA: Diagnosis not present

## 2020-02-19 DIAGNOSIS — R69 Illness, unspecified: Secondary | ICD-10-CM | POA: Diagnosis not present

## 2020-02-20 DIAGNOSIS — R69 Illness, unspecified: Secondary | ICD-10-CM | POA: Diagnosis not present

## 2020-02-21 DIAGNOSIS — R69 Illness, unspecified: Secondary | ICD-10-CM | POA: Diagnosis not present

## 2020-02-22 DIAGNOSIS — R69 Illness, unspecified: Secondary | ICD-10-CM | POA: Diagnosis not present

## 2020-02-23 DIAGNOSIS — R69 Illness, unspecified: Secondary | ICD-10-CM | POA: Diagnosis not present

## 2020-02-24 DIAGNOSIS — R69 Illness, unspecified: Secondary | ICD-10-CM | POA: Diagnosis not present

## 2020-02-25 DIAGNOSIS — R69 Illness, unspecified: Secondary | ICD-10-CM | POA: Diagnosis not present

## 2020-02-25 DIAGNOSIS — Z79899 Other long term (current) drug therapy: Secondary | ICD-10-CM | POA: Diagnosis not present

## 2020-02-26 DIAGNOSIS — R69 Illness, unspecified: Secondary | ICD-10-CM | POA: Diagnosis not present

## 2020-02-27 DIAGNOSIS — R69 Illness, unspecified: Secondary | ICD-10-CM | POA: Diagnosis not present

## 2020-02-28 DIAGNOSIS — R69 Illness, unspecified: Secondary | ICD-10-CM | POA: Diagnosis not present

## 2020-02-29 ENCOUNTER — Emergency Department (HOSPITAL_COMMUNITY)
Admission: EM | Admit: 2020-02-29 | Discharge: 2020-02-29 | Disposition: A | Discharge status: Home / Self Care | Payer: Medicare HMO | Attending: Emergency Medicine | Admitting: Emergency Medicine

## 2020-02-29 ENCOUNTER — Encounter (HOSPITAL_COMMUNITY): Payer: Self-pay | Admitting: Emergency Medicine

## 2020-02-29 ENCOUNTER — Other Ambulatory Visit: Payer: Self-pay

## 2020-02-29 DIAGNOSIS — R041 Hemorrhage from throat: Secondary | ICD-10-CM | POA: Insufficient documentation

## 2020-02-29 DIAGNOSIS — F17210 Nicotine dependence, cigarettes, uncomplicated: Secondary | ICD-10-CM | POA: Insufficient documentation

## 2020-02-29 DIAGNOSIS — T23241A Burn of second degree of multiple right fingers (nail), including thumb, initial encounter: Secondary | ICD-10-CM | POA: Diagnosis not present

## 2020-02-29 DIAGNOSIS — T23021A Burn of unspecified degree of single right finger (nail) except thumb, initial encounter: Secondary | ICD-10-CM | POA: Diagnosis not present

## 2020-02-29 DIAGNOSIS — E079 Disorder of thyroid, unspecified: Secondary | ICD-10-CM | POA: Diagnosis not present

## 2020-02-29 DIAGNOSIS — T23029A Burn of unspecified degree of unspecified single finger (nail) except thumb, initial encounter: Secondary | ICD-10-CM

## 2020-02-29 DIAGNOSIS — R0902 Hypoxemia: Secondary | ICD-10-CM | POA: Diagnosis not present

## 2020-02-29 DIAGNOSIS — S60940A Unspecified superficial injury of right index finger, initial encounter: Secondary | ICD-10-CM | POA: Diagnosis not present

## 2020-02-29 DIAGNOSIS — X088XXA Exposure to other specified smoke, fire and flames, initial encounter: Secondary | ICD-10-CM | POA: Diagnosis not present

## 2020-02-29 DIAGNOSIS — R58 Hemorrhage, not elsewhere classified: Secondary | ICD-10-CM | POA: Diagnosis not present

## 2020-02-29 DIAGNOSIS — T310 Burns involving less than 10% of body surface: Secondary | ICD-10-CM | POA: Diagnosis not present

## 2020-02-29 DIAGNOSIS — R69 Illness, unspecified: Secondary | ICD-10-CM | POA: Diagnosis not present

## 2020-02-29 LAB — COMPREHENSIVE METABOLIC PANEL
ALT: 15 U/L (ref 0–44)
AST: 15 U/L (ref 15–41)
Albumin: 3.7 g/dL (ref 3.5–5.0)
Alkaline Phosphatase: 108 U/L (ref 38–126)
Anion gap: 11 (ref 5–15)
BUN: 10 mg/dL (ref 6–20)
CO2: 28 mmol/L (ref 22–32)
Calcium: 9.3 mg/dL (ref 8.9–10.3)
Chloride: 103 mmol/L (ref 98–111)
Creatinine, Ser: 0.56 mg/dL (ref 0.44–1.00)
GFR, Estimated: >60 mL/min (ref >60)
Glucose, Bld: 95 mg/dL (ref 70–99)
Potassium: 3.9 mmol/L (ref 3.5–5.1)
Sodium: 142 mmol/L (ref 135–145)
Total Bilirubin: 1.2 mg/dL (ref 0.3–1.2)
Total Protein: 6.6 g/dL (ref 6.5–8.1)

## 2020-02-29 LAB — GROUP A STREP BY PCR: Group A Strep by PCR: NOT DETECTED

## 2020-02-29 LAB — URINALYSIS, ROUTINE W REFLEX MICROSCOPIC
Bilirubin Urine: NEGATIVE
Glucose, UA: NEGATIVE mg/dL
Hgb urine dipstick: NEGATIVE
Ketones, ur: NEGATIVE mg/dL
Leukocytes,Ua: NEGATIVE
Nitrite: NEGATIVE
Protein, ur: NEGATIVE mg/dL
Specific Gravity, Urine: 1.013 (ref 1.005–1.030)
pH: 6 (ref 5.0–8.0)

## 2020-02-29 LAB — CBC WITH DIFFERENTIAL/PLATELET
Abs Immature Granulocytes: 0.01 10*3/uL (ref 0.00–0.07)
Basophils Absolute: 0.1 10*3/uL (ref 0.0–0.1)
Basophils Relative: 1 %
Eosinophils Absolute: 0.2 10*3/uL (ref 0.0–0.5)
Eosinophils Relative: 3 %
HCT: 50 % — ABNORMAL HIGH (ref 36.0–46.0)
Hemoglobin: 16.4 g/dL — ABNORMAL HIGH (ref 12.0–15.0)
Immature Granulocytes: 0 %
Lymphocytes Relative: 31 %
Lymphs Abs: 2.2 10*3/uL (ref 0.7–4.0)
MCH: 29.8 pg (ref 26.0–34.0)
MCHC: 32.8 g/dL (ref 30.0–36.0)
MCV: 90.7 fL (ref 80.0–100.0)
Monocytes Absolute: 0.5 10*3/uL (ref 0.1–1.0)
Monocytes Relative: 6 %
Neutro Abs: 4.2 10*3/uL (ref 1.7–7.7)
Neutrophils Relative %: 59 %
Platelets: 201 10*3/uL (ref 150–400)
RBC: 5.51 MIL/uL — ABNORMAL HIGH (ref 3.87–5.11)
RDW: 13 % (ref 11.5–15.5)
WBC: 7.1 10*3/uL (ref 4.0–10.5)
nRBC: 0 % (ref 0.0–0.2)

## 2020-02-29 NOTE — ED Notes (Signed)
[facility] notified that the patient would be arriving via [facility].

## 2020-02-29 NOTE — ED Notes (Signed)
Discharge report given to [facility].

## 2020-02-29 NOTE — ED Notes (Signed)
[facility] cancelled. [facility] called for patient to return to [facility]

## 2020-02-29 NOTE — ED Notes (Signed)
[facility] called for transport to [facility]

## 2020-02-29 NOTE — ED Notes (Signed)
[facility] unable to take the patient to [facility]. Stated that this was not on their list of accepted facilities.  Social worker called and is to fax a Clinical cytogeneticist.

## 2020-02-29 NOTE — ED Triage Notes (Signed)
Per EMS-patient from facility-patient states she is bleeding in the back of her throat-states she thinks something is wrong but doesn't know what's wrong, just knows she has to come to hospital

## 2020-02-29 NOTE — Discharge Instructions (Addendum)
Return if any problems.
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
## 2020-02-29 NOTE — ED Notes (Signed)
Patient has a urine culture in the main lab 

## 2020-03-01 DIAGNOSIS — R69 Illness, unspecified: Secondary | ICD-10-CM | POA: Diagnosis not present

## 2020-03-02 DIAGNOSIS — R69 Illness, unspecified: Secondary | ICD-10-CM | POA: Diagnosis not present

## 2020-03-03 DIAGNOSIS — R69 Illness, unspecified: Secondary | ICD-10-CM | POA: Diagnosis not present

## 2020-03-04 DIAGNOSIS — E876 Hypokalemia: Secondary | ICD-10-CM | POA: Diagnosis not present

## 2020-03-04 DIAGNOSIS — R69 Illness, unspecified: Secondary | ICD-10-CM | POA: Diagnosis not present

## 2020-03-04 DIAGNOSIS — G40909 Epilepsy, unspecified, not intractable, without status epilepticus: Secondary | ICD-10-CM | POA: Diagnosis not present

## 2020-03-04 DIAGNOSIS — E538 Deficiency of other specified B group vitamins: Secondary | ICD-10-CM | POA: Diagnosis not present

## 2020-03-04 DIAGNOSIS — E039 Hypothyroidism, unspecified: Secondary | ICD-10-CM | POA: Diagnosis not present

## 2020-03-04 DIAGNOSIS — E559 Vitamin D deficiency, unspecified: Secondary | ICD-10-CM | POA: Diagnosis not present

## 2020-03-05 ENCOUNTER — Encounter (HOSPITAL_COMMUNITY): Payer: Self-pay | Admitting: Emergency Medicine

## 2020-03-05 ENCOUNTER — Emergency Department (HOSPITAL_COMMUNITY): Payer: Medicare HMO

## 2020-03-05 ENCOUNTER — Emergency Department (HOSPITAL_COMMUNITY)
Admission: EM | Admit: 2020-03-05 | Discharge: 2020-03-06 | Disposition: A | Discharge status: Home / Self Care | Payer: Medicare HMO | Attending: Emergency Medicine | Admitting: Emergency Medicine

## 2020-03-05 DIAGNOSIS — M79641 Pain in right hand: Secondary | ICD-10-CM | POA: Diagnosis not present

## 2020-03-05 DIAGNOSIS — Z20822 Contact with and (suspected) exposure to covid-19: Secondary | ICD-10-CM | POA: Insufficient documentation

## 2020-03-05 DIAGNOSIS — R58 Hemorrhage, not elsewhere classified: Secondary | ICD-10-CM | POA: Diagnosis not present

## 2020-03-05 DIAGNOSIS — Z79899 Other long term (current) drug therapy: Secondary | ICD-10-CM | POA: Diagnosis not present

## 2020-03-05 DIAGNOSIS — Z23 Encounter for immunization: Secondary | ICD-10-CM | POA: Diagnosis not present

## 2020-03-05 DIAGNOSIS — E039 Hypothyroidism, unspecified: Secondary | ICD-10-CM | POA: Insufficient documentation

## 2020-03-05 DIAGNOSIS — Y92129 Unspecified place in nursing home as the place of occurrence of the external cause: Secondary | ICD-10-CM | POA: Insufficient documentation

## 2020-03-05 DIAGNOSIS — R69 Illness, unspecified: Secondary | ICD-10-CM | POA: Diagnosis not present

## 2020-03-05 DIAGNOSIS — F17210 Nicotine dependence, cigarettes, uncomplicated: Secondary | ICD-10-CM | POA: Diagnosis not present

## 2020-03-05 DIAGNOSIS — T300 Burn of unspecified body region, unspecified degree: Secondary | ICD-10-CM

## 2020-03-05 DIAGNOSIS — T23209A Burn of second degree of unspecified hand, unspecified site, initial encounter: Secondary | ICD-10-CM | POA: Diagnosis not present

## 2020-03-05 DIAGNOSIS — R0902 Hypoxemia: Secondary | ICD-10-CM | POA: Diagnosis not present

## 2020-03-05 DIAGNOSIS — X088XXA Exposure to other specified smoke, fire and flames, initial encounter: Secondary | ICD-10-CM | POA: Insufficient documentation

## 2020-03-05 DIAGNOSIS — T23241A Burn of second degree of multiple right fingers (nail), including thumb, initial encounter: Secondary | ICD-10-CM | POA: Diagnosis not present

## 2020-03-05 DIAGNOSIS — T310 Burns involving less than 10% of body surface: Secondary | ICD-10-CM | POA: Diagnosis not present

## 2020-03-05 DIAGNOSIS — F259 Schizoaffective disorder, unspecified: Secondary | ICD-10-CM | POA: Diagnosis not present

## 2020-03-05 DIAGNOSIS — T23031A Burn of unspecified degree of multiple right fingers (nail), not including thumb, initial encounter: Secondary | ICD-10-CM | POA: Insufficient documentation

## 2020-03-05 MED ORDER — TETANUS-DIPHTH-ACELL PERTUSSIS 5-2.5-18.5 LF-MCG/0.5 IM SUSP
0.5000 mL | Freq: Once | INTRAMUSCULAR | Status: AC
  Administered 2020-03-06: 0.5 mL via INTRAMUSCULAR
  Filled 2020-03-05: qty 0.5

## 2020-03-05 MED ORDER — BACITRACIN ZINC 500 UNIT/GM EX OINT
TOPICAL_OINTMENT | Freq: Two times a day (BID) | CUTANEOUS | Status: DC
  Filled 2020-03-05: qty 1.8

## 2020-03-05 NOTE — ED Triage Notes (Signed)
Pt from [facility] living facility. Per EMS, pt became combative with staff after asking staff to change her brief. Pt has hx of schizo. Pts guardian requested pt be seen in the ED.
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
## 2020-03-06 ENCOUNTER — Encounter (HOSPITAL_COMMUNITY): Payer: Self-pay | Admitting: Emergency Medicine

## 2020-03-06 ENCOUNTER — Emergency Department (HOSPITAL_COMMUNITY)
Admission: EM | Admit: 2020-03-06 | Discharge: 2020-03-07 | Disposition: A | Discharge status: Home / Self Care | Payer: Medicare HMO | Source: Home / Self Care | Attending: Emergency Medicine | Admitting: Emergency Medicine

## 2020-03-06 DIAGNOSIS — T23029A Burn of unspecified degree of unspecified single finger (nail) except thumb, initial encounter: Secondary | ICD-10-CM

## 2020-03-06 DIAGNOSIS — Z79899 Other long term (current) drug therapy: Secondary | ICD-10-CM | POA: Insufficient documentation

## 2020-03-06 DIAGNOSIS — R531 Weakness: Secondary | ICD-10-CM | POA: Diagnosis not present

## 2020-03-06 DIAGNOSIS — R279 Unspecified lack of coordination: Secondary | ICD-10-CM | POA: Diagnosis not present

## 2020-03-06 DIAGNOSIS — F17210 Nicotine dependence, cigarettes, uncomplicated: Secondary | ICD-10-CM | POA: Insufficient documentation

## 2020-03-06 DIAGNOSIS — F259 Schizoaffective disorder, unspecified: Secondary | ICD-10-CM

## 2020-03-06 DIAGNOSIS — Z20822 Contact with and (suspected) exposure to covid-19: Secondary | ICD-10-CM | POA: Insufficient documentation

## 2020-03-06 DIAGNOSIS — R0902 Hypoxemia: Secondary | ICD-10-CM | POA: Diagnosis not present

## 2020-03-06 DIAGNOSIS — R69 Illness, unspecified: Secondary | ICD-10-CM | POA: Diagnosis not present

## 2020-03-06 DIAGNOSIS — R58 Hemorrhage, not elsewhere classified: Secondary | ICD-10-CM | POA: Diagnosis not present

## 2020-03-06 DIAGNOSIS — T23209A Burn of second degree of unspecified hand, unspecified site, initial encounter: Secondary | ICD-10-CM | POA: Diagnosis not present

## 2020-03-06 DIAGNOSIS — T310 Burns involving less than 10% of body surface: Secondary | ICD-10-CM | POA: Diagnosis not present

## 2020-03-06 DIAGNOSIS — Z743 Need for continuous supervision: Secondary | ICD-10-CM | POA: Diagnosis not present

## 2020-03-06 LAB — RESPIRATORY PANEL BY RT PCR (FLU A&B, COVID)
Influenza A by PCR: NEGATIVE
Influenza B by PCR: NEGATIVE
SARS Coronavirus 2 by RT PCR: NEGATIVE

## 2020-03-06 LAB — CBC
HCT: 49.1 % — ABNORMAL HIGH (ref 36.0–46.0)
Hemoglobin: 15.9 g/dL — ABNORMAL HIGH (ref 12.0–15.0)
MCH: 29.1 pg (ref 26.0–34.0)
MCHC: 32.4 g/dL (ref 30.0–36.0)
MCV: 89.9 fL (ref 80.0–100.0)
Platelets: 191 10*3/uL (ref 150–400)
RBC: 5.46 MIL/uL — ABNORMAL HIGH (ref 3.87–5.11)
RDW: 13 % (ref 11.5–15.5)
WBC: 10.1 10*3/uL (ref 4.0–10.5)
nRBC: 0 % (ref 0.0–0.2)

## 2020-03-06 LAB — TSH: TSH: 1.579 u[IU]/mL (ref 0.350–4.500)

## 2020-03-06 LAB — COMPREHENSIVE METABOLIC PANEL
ALT: 16 U/L (ref 0–44)
AST: 13 U/L — ABNORMAL LOW (ref 15–41)
Albumin: 3.3 g/dL — ABNORMAL LOW (ref 3.5–5.0)
Alkaline Phosphatase: 99 U/L (ref 38–126)
Anion gap: 9 (ref 5–15)
BUN: 8 mg/dL (ref 6–20)
CO2: 25 mmol/L (ref 22–32)
Calcium: 9.3 mg/dL (ref 8.9–10.3)
Chloride: 108 mmol/L (ref 98–111)
Creatinine, Ser: 0.54 mg/dL (ref 0.44–1.00)
GFR, Estimated: >60 mL/min (ref >60)
Glucose, Bld: 97 mg/dL (ref 70–99)
Potassium: 3.8 mmol/L (ref 3.5–5.1)
Sodium: 142 mmol/L (ref 135–145)
Total Bilirubin: 0.5 mg/dL (ref 0.3–1.2)
Total Protein: 6.1 g/dL — ABNORMAL LOW (ref 6.5–8.1)

## 2020-03-06 LAB — I-STAT BETA HCG BLOOD, ED (MC, WL, AP ONLY): I-stat hCG, quantitative: 24.7 m[IU]/mL — ABNORMAL HIGH (ref <5)

## 2020-03-06 LAB — ETHANOL: Alcohol, Ethyl (B): <10 mg/dL (ref <10)

## 2020-03-06 MED ORDER — BACITRACIN ZINC 500 UNIT/GM EX OINT: 1.0000 "application " | TOPICAL_OINTMENT | Freq: Two times a day (BID) | CUTANEOUS | 0 refills | Status: AC
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
## 2020-03-06 NOTE — ED Notes (Signed)
[facility] called for transport.  
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
## 2020-03-06 NOTE — ED Notes (Signed)
PTAR called for transport to [facility].

## 2020-03-06 NOTE — ED Triage Notes (Signed)
Pt arrives via EMS from [facility]. Pt has hx of schizo. Pt believes she has bleeding in her abd. Denies SI or HI

## 2020-03-06 NOTE — Discharge Instructions (Signed)
Bacitracin ointment to fingers twice daily
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
## 2020-03-07 DIAGNOSIS — Z743 Need for continuous supervision: Secondary | ICD-10-CM | POA: Diagnosis not present

## 2020-03-07 DIAGNOSIS — Z046 Encounter for general psychiatric examination, requested by authority: Secondary | ICD-10-CM | POA: Diagnosis not present

## 2020-03-07 DIAGNOSIS — R5381 Other malaise: Secondary | ICD-10-CM | POA: Diagnosis not present

## 2020-03-07 DIAGNOSIS — R279 Unspecified lack of coordination: Secondary | ICD-10-CM | POA: Diagnosis not present

## 2020-03-07 DIAGNOSIS — R69 Illness, unspecified: Secondary | ICD-10-CM | POA: Diagnosis not present

## 2020-03-08 DIAGNOSIS — R69 Illness, unspecified: Secondary | ICD-10-CM | POA: Diagnosis not present

## 2020-03-09 DIAGNOSIS — R69 Illness, unspecified: Secondary | ICD-10-CM | POA: Diagnosis not present

## 2020-03-10 DIAGNOSIS — R69 Illness, unspecified: Secondary | ICD-10-CM | POA: Diagnosis not present

## 2020-03-11 DIAGNOSIS — T23001A Burn of unspecified degree of right hand, unspecified site, initial encounter: Secondary | ICD-10-CM | POA: Diagnosis not present

## 2020-03-11 DIAGNOSIS — E876 Hypokalemia: Secondary | ICD-10-CM | POA: Diagnosis not present

## 2020-03-11 DIAGNOSIS — E538 Deficiency of other specified B group vitamins: Secondary | ICD-10-CM | POA: Diagnosis not present

## 2020-03-11 DIAGNOSIS — R69 Illness, unspecified: Secondary | ICD-10-CM | POA: Diagnosis not present

## 2020-03-11 DIAGNOSIS — E039 Hypothyroidism, unspecified: Secondary | ICD-10-CM | POA: Diagnosis not present

## 2020-03-11 DIAGNOSIS — G40909 Epilepsy, unspecified, not intractable, without status epilepticus: Secondary | ICD-10-CM | POA: Diagnosis not present

## 2020-03-11 DIAGNOSIS — E559 Vitamin D deficiency, unspecified: Secondary | ICD-10-CM | POA: Diagnosis not present

## 2020-03-13 DIAGNOSIS — Z79899 Other long term (current) drug therapy: Secondary | ICD-10-CM | POA: Diagnosis not present

## 2020-03-13 DIAGNOSIS — I10 Essential (primary) hypertension: Secondary | ICD-10-CM | POA: Diagnosis not present

## 2020-03-16 DIAGNOSIS — R69 Illness, unspecified: Secondary | ICD-10-CM | POA: Diagnosis not present

## 2020-03-17 ENCOUNTER — Ambulatory Visit (HOSPITAL_COMMUNITY)
Admission: EM | Admit: 2020-03-17 | Discharge: 2020-03-18 | Disposition: A | Discharge status: Home / Self Care | Payer: Medicare HMO | Attending: Psychiatry | Admitting: Psychiatry

## 2020-03-17 ENCOUNTER — Other Ambulatory Visit: Payer: Self-pay

## 2020-03-17 ENCOUNTER — Encounter (HOSPITAL_COMMUNITY): Payer: Self-pay

## 2020-03-17 DIAGNOSIS — R440 Auditory hallucinations: Secondary | ICD-10-CM | POA: Diagnosis not present

## 2020-03-17 DIAGNOSIS — F259 Schizoaffective disorder, unspecified: Secondary | ICD-10-CM

## 2020-03-17 DIAGNOSIS — R69 Illness, unspecified: Secondary | ICD-10-CM | POA: Diagnosis not present
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
## 2020-03-18 ENCOUNTER — Emergency Department (HOSPITAL_COMMUNITY)
Admission: EM | Admit: 2020-03-18 | Discharge: 2020-03-18 | Disposition: A | Discharge status: Home / Self Care | Payer: Medicare HMO | Attending: Emergency Medicine | Admitting: Emergency Medicine

## 2020-03-18 ENCOUNTER — Encounter (HOSPITAL_COMMUNITY): Payer: Self-pay

## 2020-03-18 DIAGNOSIS — Z20822 Contact with and (suspected) exposure to covid-19: Secondary | ICD-10-CM | POA: Insufficient documentation

## 2020-03-18 DIAGNOSIS — F17210 Nicotine dependence, cigarettes, uncomplicated: Secondary | ICD-10-CM | POA: Diagnosis not present

## 2020-03-18 DIAGNOSIS — I469 Cardiac arrest, cause unspecified: Secondary | ICD-10-CM | POA: Diagnosis not present

## 2020-03-18 DIAGNOSIS — R4182 Altered mental status, unspecified: Secondary | ICD-10-CM | POA: Diagnosis not present

## 2020-03-18 DIAGNOSIS — R279 Unspecified lack of coordination: Secondary | ICD-10-CM | POA: Diagnosis not present

## 2020-03-18 DIAGNOSIS — J3489 Other specified disorders of nose and nasal sinuses: Secondary | ICD-10-CM | POA: Diagnosis not present

## 2020-03-18 DIAGNOSIS — R52 Pain, unspecified: Secondary | ICD-10-CM | POA: Diagnosis not present

## 2020-03-18 DIAGNOSIS — J029 Acute pharyngitis, unspecified: Secondary | ICD-10-CM | POA: Diagnosis not present

## 2020-03-18 DIAGNOSIS — R0902 Hypoxemia: Secondary | ICD-10-CM | POA: Diagnosis not present

## 2020-03-18 DIAGNOSIS — E039 Hypothyroidism, unspecified: Secondary | ICD-10-CM | POA: Diagnosis not present

## 2020-03-18 DIAGNOSIS — F250 Schizoaffective disorder, bipolar type: Secondary | ICD-10-CM

## 2020-03-18 DIAGNOSIS — R69 Illness, unspecified: Secondary | ICD-10-CM | POA: Diagnosis not present

## 2020-03-18 DIAGNOSIS — Z743 Need for continuous supervision: Secondary | ICD-10-CM | POA: Diagnosis not present

## 2020-03-18 DIAGNOSIS — R070 Pain in throat: Secondary | ICD-10-CM | POA: Diagnosis not present

## 2020-03-18 LAB — RESPIRATORY PANEL BY RT PCR (FLU A&B, COVID)
Influenza A by PCR: NEGATIVE
Influenza B by PCR: NEGATIVE
SARS Coronavirus 2 by RT PCR: NEGATIVE

## 2020-03-18 LAB — GROUP A STREP BY PCR: Group A Strep by PCR: NOT DETECTED

## 2020-03-18 NOTE — ED Provider Notes (Signed)
Behavioral Health Urgent Care Medical Screening Exam  Patient Name: Martha Peters MRN: [id number] Date of Evaluation: 03/18/20 Chief Complaint:   Diagnosis:  Final diagnoses:  Chronic schizoaffective disorder ([place])    History of Present illness: Martha Peters is a 54 y.o. female who presents voluntarily to [facility] with law enforcement from [place] after reportedly calling 911 multiple times today. Patient reports that she is here because she is bleeding from her head, she then states "I am bleeding inside my head." Patient reports that she has a history of auditory hallucinations and states "I am on medication that helps with that." She denies that she is currently experiencing AVH. She does not appear to be responding to internal stimuli. She denies suicidal ideations. She denies homicidal ideations.   On evaluation patient is alert and oriented x 4, pleasant, and cooperative. Speech is clear and coherent. Mood is depressed and affect is congruent with mood. Thought process is coherent.  Denies auditory and visual hallucinations. No indication that patient is responding to internal stimuli. Possible delusional thought content related to "my head is bleeding." Denies suicidal ideations. Denies homicidal ideations.   See TTS note for collateral.  Psychiatric Specialty Exam  Presentation  General Appearance:Neat  Eye Contact:Good  Speech:Clear and Coherent;Normal Rate  Speech Volume:Normal  Handedness:No data recorded  Mood and Affect  Mood:Anxious;Depressed  Affect:Flat   Thought Process  Thought Processes:Coherent  Descriptions of Associations:Intact  Orientation:Full (Time, Place and Person)  Thought Content:Other (comment) (patient states that he head is bleeding)  Hallucinations:None;Other (comment) (patient reports that she has a history of auditory hallucinations)  Ideas of Reference:Delusions  Suicidal Thoughts:No  Homicidal  Thoughts:No   Sensorium  Memory:Immediate Fair;Recent Fair;Remote Fair  Judgment:Intact  Insight:Fair   Executive Functions  Concentration:Good  Attention Span:Good  [place]  Language:Good   Psychomotor Activity  Psychomotor Activity:Normal   Assets  Assets:Communication Skills;Desire for Improvement;Financial Resources/Insurance;Housing;Physical Health   Sleep  Sleep:Fair  Number of hours: No data recorded  Physical Exam: Physical Exam Constitutional:      General: She is not in acute distress.    Appearance: She is not ill-appearing, toxic-appearing or diaphoretic.  HENT:     Head: Normocephalic.     Right Ear: External ear normal.     Left Ear: External ear normal.  Eyes:     Conjunctiva/sclera: Conjunctivae normal.     Pupils: Pupils are equal, round, and reactive to light.  Cardiovascular:     Rate and Rhythm: Normal rate.  Pulmonary:     Effort: Pulmonary effort is normal. No respiratory distress.  Musculoskeletal:        General: Normal range of motion.  Skin:    General: Skin is warm and dry.  Neurological:     Mental Status: She is alert and oriented to person, place, and time.  Psychiatric:        Mood and Affect: Mood is anxious and depressed.        Thought Content: Thought content is not paranoid. Thought content does not include homicidal or suicidal ideation.    Review of Systems  Constitutional: Negative for chills, diaphoresis, fever, malaise/fatigue and weight loss.  HENT: Negative for congestion.   Respiratory: Negative for cough and shortness of breath.   Cardiovascular: Negative for chest pain and palpitations.  Gastrointestinal: Negative for diarrhea, nausea and vomiting.  Neurological: Negative for dizziness and seizures.  Psychiatric/Behavioral: Positive for depression. Negative for hallucinations, memory loss, substance abuse  and suicidal ideas. The patient is nervous/anxious and has insomnia.    All other systems reviewed and are negative.  Blood pressure 117/72, pulse 78, temperature 97.7 F (36.5 C), temperature source Temporal, resp. rate 16, SpO2 97 %. There is no height or weight on file to calculate BMI.  Musculoskeletal: Strength & Muscle Tone: within normal limits Gait & Station: normal Patient leans: N/A   [place] MSE Discharge Disposition for Follow up and Recommendations: Based on my evaluation the patient does not appear to have an emergency medical condition and can be discharged with resources and follow up care in outpatient services for Medication Management and Individual Therapy   Disposition: No evidence of imminent risk to self or others at present.   Patient does not meet criteria for psychiatric inpatient admission. Supportive therapy provided about ongoing stressors. Discussed crisis plan, support from social network, calling 911, coming to the Emergency Department, and calling Suicide Hotline.   Follow-Up with current psychiatric provider  Continue current home medications     Rozetta Nunnery, NP 03/18/2020, 12:28 AM

## 2020-03-18 NOTE — ED Notes (Addendum)
Attempted to call [employer], no answer.
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
## 2020-03-18 NOTE — ED Notes (Signed)
[employer] being contacted to provide transportation back to facility pt resides at. Pt calm & cooperative with direct supervision of [facility] throughout assessment period.

## 2020-03-18 NOTE — ED Notes (Signed)
[facility] called for transport.  

## 2020-03-18 NOTE — ED Notes (Signed)
Pt legal guardian contacted in regard to pt status and d/c back to [facility]. No further questions at this time.

## 2020-03-18 NOTE — ED Triage Notes (Signed)
Pt arrived via EMS from [facility]. C/o nasal congestion and sore throat since this morning. Denies any other sx. Hx of schizophrenia. Denies any sick contacts.

## 2020-03-18 NOTE — ED Notes (Addendum)
Pt left by [facility] pt alert and ambulatory upon discharge.

## 2020-03-18 NOTE — Discharge Instructions (Addendum)
You were seen in the emergency department for a sore throat.  You had a strep test that was negative and your Covid testing was negative.  Recommend Tylenol, warm salt water gargles.  Return if any worsening or concerning symptoms

## 2020-03-19 DIAGNOSIS — J984 Other disorders of lung: Secondary | ICD-10-CM | POA: Diagnosis not present

## 2020-03-19 DIAGNOSIS — F419 Anxiety disorder, unspecified: Secondary | ICD-10-CM | POA: Diagnosis not present

## 2020-03-19 DIAGNOSIS — Z886 Allergy status to analgesic agent status: Secondary | ICD-10-CM | POA: Diagnosis not present

## 2020-03-19 DIAGNOSIS — Z046 Encounter for general psychiatric examination, requested by authority: Secondary | ICD-10-CM | POA: Diagnosis not present

## 2020-03-19 DIAGNOSIS — Z79899 Other long term (current) drug therapy: Secondary | ICD-10-CM | POA: Diagnosis not present

## 2020-03-19 DIAGNOSIS — R69 Illness, unspecified: Secondary | ICD-10-CM | POA: Diagnosis not present

## 2020-03-19 DIAGNOSIS — Z888 Allergy status to other drugs, medicaments and biological substances status: Secondary | ICD-10-CM | POA: Diagnosis not present

## 2020-03-19 DIAGNOSIS — F209 Schizophrenia, unspecified: Secondary | ICD-10-CM | POA: Diagnosis not present

## 2020-03-19 DIAGNOSIS — Z87891 Personal history of nicotine dependence: Secondary | ICD-10-CM | POA: Diagnosis not present

## 2020-03-19 DIAGNOSIS — Z20822 Contact with and (suspected) exposure to covid-19: Secondary | ICD-10-CM | POA: Diagnosis not present

## 2020-03-19 DIAGNOSIS — Z91018 Allergy to other foods: Secondary | ICD-10-CM | POA: Diagnosis not present

## 2020-03-20 DIAGNOSIS — Z9049 Acquired absence of other specified parts of digestive tract: Secondary | ICD-10-CM | POA: Diagnosis not present

## 2020-03-20 DIAGNOSIS — Z046 Encounter for general psychiatric examination, requested by authority: Secondary | ICD-10-CM | POA: Diagnosis not present

## 2020-03-20 DIAGNOSIS — F250 Schizoaffective disorder, bipolar type: Secondary | ICD-10-CM | POA: Diagnosis not present

## 2020-03-20 DIAGNOSIS — Z87891 Personal history of nicotine dependence: Secondary | ICD-10-CM | POA: Diagnosis not present

## 2020-03-20 DIAGNOSIS — Z79899 Other long term (current) drug therapy: Secondary | ICD-10-CM | POA: Diagnosis not present

## 2020-03-20 DIAGNOSIS — Z9151 Personal history of suicidal behavior: Secondary | ICD-10-CM | POA: Diagnosis not present

## 2020-03-20 DIAGNOSIS — E559 Vitamin D deficiency, unspecified: Secondary | ICD-10-CM | POA: Diagnosis not present

## 2020-03-20 DIAGNOSIS — Z20822 Contact with and (suspected) exposure to covid-19: Secondary | ICD-10-CM | POA: Diagnosis not present

## 2020-03-20 DIAGNOSIS — Z888 Allergy status to other drugs, medicaments and biological substances status: Secondary | ICD-10-CM | POA: Diagnosis not present

## 2020-03-20 DIAGNOSIS — F209 Schizophrenia, unspecified: Secondary | ICD-10-CM | POA: Diagnosis not present

## 2020-03-20 DIAGNOSIS — Z886 Allergy status to analgesic agent status: Secondary | ICD-10-CM | POA: Diagnosis not present

## 2020-03-20 DIAGNOSIS — R69 Illness, unspecified: Secondary | ICD-10-CM | POA: Diagnosis not present

## 2020-03-20 DIAGNOSIS — Z91018 Allergy to other foods: Secondary | ICD-10-CM | POA: Diagnosis not present

## 2020-03-20 DIAGNOSIS — Z7189 Other specified counseling: Secondary | ICD-10-CM | POA: Diagnosis not present

## 2020-03-21 DIAGNOSIS — E559 Vitamin D deficiency, unspecified: Secondary | ICD-10-CM | POA: Diagnosis not present

## 2020-03-21 DIAGNOSIS — R69 Illness, unspecified: Secondary | ICD-10-CM | POA: Diagnosis not present

## 2020-03-22 DIAGNOSIS — R69 Illness, unspecified: Secondary | ICD-10-CM | POA: Diagnosis not present

## 2020-03-22 DIAGNOSIS — E559 Vitamin D deficiency, unspecified: Secondary | ICD-10-CM | POA: Diagnosis not present

## 2020-03-23 DIAGNOSIS — R69 Illness, unspecified: Secondary | ICD-10-CM | POA: Diagnosis not present

## 2020-03-24 DIAGNOSIS — R69 Illness, unspecified: Secondary | ICD-10-CM | POA: Diagnosis not present

## 2020-03-25 DIAGNOSIS — R69 Illness, unspecified: Secondary | ICD-10-CM | POA: Diagnosis not present

## 2020-03-26 DIAGNOSIS — R69 Illness, unspecified: Secondary | ICD-10-CM | POA: Diagnosis not present

## 2020-03-27 DIAGNOSIS — R69 Illness, unspecified: Secondary | ICD-10-CM | POA: Diagnosis not present

## 2020-03-28 DIAGNOSIS — R69 Illness, unspecified: Secondary | ICD-10-CM | POA: Diagnosis not present

## 2020-03-29 DIAGNOSIS — R69 Illness, unspecified: Secondary | ICD-10-CM | POA: Diagnosis not present

## 2020-03-30 DIAGNOSIS — R69 Illness, unspecified: Secondary | ICD-10-CM | POA: Diagnosis not present

## 2020-03-31 DIAGNOSIS — R69 Illness, unspecified: Secondary | ICD-10-CM | POA: Diagnosis not present

## 2020-04-01 DIAGNOSIS — R69 Illness, unspecified: Secondary | ICD-10-CM | POA: Diagnosis not present

## 2020-04-02 DIAGNOSIS — R69 Illness, unspecified: Secondary | ICD-10-CM | POA: Diagnosis not present

## 2020-04-03 DIAGNOSIS — Z9049 Acquired absence of other specified parts of digestive tract: Secondary | ICD-10-CM | POA: Diagnosis not present

## 2020-04-03 DIAGNOSIS — Z7189 Other specified counseling: Secondary | ICD-10-CM | POA: Diagnosis not present

## 2020-04-03 DIAGNOSIS — R69 Illness, unspecified: Secondary | ICD-10-CM | POA: Diagnosis not present

## 2020-04-04 DIAGNOSIS — R69 Illness, unspecified: Secondary | ICD-10-CM | POA: Diagnosis not present

## 2020-04-05 DIAGNOSIS — R69 Illness, unspecified: Secondary | ICD-10-CM | POA: Diagnosis not present

## 2020-04-06 DIAGNOSIS — R69 Illness, unspecified: Secondary | ICD-10-CM | POA: Diagnosis not present

## 2020-04-07 DIAGNOSIS — R69 Illness, unspecified: Secondary | ICD-10-CM | POA: Diagnosis not present

## 2020-04-08 DIAGNOSIS — R69 Illness, unspecified: Secondary | ICD-10-CM | POA: Diagnosis not present

## 2020-04-09 DIAGNOSIS — R69 Illness, unspecified: Secondary | ICD-10-CM | POA: Diagnosis not present

## 2020-04-10 DIAGNOSIS — R69 Illness, unspecified: Secondary | ICD-10-CM | POA: Diagnosis not present

## 2020-04-11 DIAGNOSIS — R69 Illness, unspecified: Secondary | ICD-10-CM | POA: Diagnosis not present

## 2020-04-12 DIAGNOSIS — R69 Illness, unspecified: Secondary | ICD-10-CM | POA: Diagnosis not present

## 2020-04-13 DIAGNOSIS — R279 Unspecified lack of coordination: Secondary | ICD-10-CM | POA: Diagnosis not present

## 2020-04-13 DIAGNOSIS — Z743 Need for continuous supervision: Secondary | ICD-10-CM | POA: Diagnosis not present

## 2020-04-13 DIAGNOSIS — R5381 Other malaise: Secondary | ICD-10-CM | POA: Diagnosis not present

## 2020-04-13 DIAGNOSIS — R69 Illness, unspecified: Secondary | ICD-10-CM | POA: Diagnosis not present

## 2020-05-01 DIAGNOSIS — R69 Illness, unspecified: Secondary | ICD-10-CM | POA: Diagnosis not present

## 2020-05-01 DIAGNOSIS — E559 Vitamin D deficiency, unspecified: Secondary | ICD-10-CM | POA: Diagnosis not present

## 2020-05-01 DIAGNOSIS — G40909 Epilepsy, unspecified, not intractable, without status epilepticus: Secondary | ICD-10-CM | POA: Diagnosis not present

## 2020-05-01 DIAGNOSIS — Z720 Tobacco use: Secondary | ICD-10-CM | POA: Diagnosis not present

## 2020-05-12 DIAGNOSIS — R69 Illness, unspecified: Secondary | ICD-10-CM | POA: Diagnosis not present

## 2020-05-12 DIAGNOSIS — F419 Anxiety disorder, unspecified: Secondary | ICD-10-CM | POA: Diagnosis not present

## 2020-05-22 DIAGNOSIS — E039 Hypothyroidism, unspecified: Secondary | ICD-10-CM | POA: Diagnosis not present

## 2020-05-22 DIAGNOSIS — R69 Illness, unspecified: Secondary | ICD-10-CM | POA: Diagnosis not present

## 2020-05-22 DIAGNOSIS — E559 Vitamin D deficiency, unspecified: Secondary | ICD-10-CM | POA: Diagnosis not present

## 2020-05-22 DIAGNOSIS — G40909 Epilepsy, unspecified, not intractable, without status epilepticus: Secondary | ICD-10-CM | POA: Diagnosis not present

## 2020-05-28 DIAGNOSIS — R69 Illness, unspecified: Secondary | ICD-10-CM | POA: Diagnosis not present

## 2020-05-28 DIAGNOSIS — Z79899 Other long term (current) drug therapy: Secondary | ICD-10-CM | POA: Diagnosis not present

## 2020-05-28 DIAGNOSIS — F419 Anxiety disorder, unspecified: Secondary | ICD-10-CM | POA: Diagnosis not present

## 2020-06-02 DIAGNOSIS — R69 Illness, unspecified: Secondary | ICD-10-CM | POA: Diagnosis not present

## 2020-06-02 DIAGNOSIS — F419 Anxiety disorder, unspecified: Secondary | ICD-10-CM | POA: Diagnosis not present

## 2020-06-05 DIAGNOSIS — R69 Illness, unspecified: Secondary | ICD-10-CM | POA: Diagnosis not present

## 2020-06-05 DIAGNOSIS — G40909 Epilepsy, unspecified, not intractable, without status epilepticus: Secondary | ICD-10-CM | POA: Diagnosis not present

## 2020-06-05 DIAGNOSIS — Z720 Tobacco use: Secondary | ICD-10-CM | POA: Diagnosis not present

## 2020-06-05 DIAGNOSIS — E559 Vitamin D deficiency, unspecified: Secondary | ICD-10-CM | POA: Diagnosis not present

## 2020-06-05 DIAGNOSIS — G4700 Insomnia, unspecified: Secondary | ICD-10-CM | POA: Diagnosis not present

## 2020-06-05 DIAGNOSIS — Z9181 History of falling: Secondary | ICD-10-CM | POA: Diagnosis not present

## 2020-06-08 DIAGNOSIS — H43813 Vitreous degeneration, bilateral: Secondary | ICD-10-CM | POA: Diagnosis not present

## 2020-06-08 DIAGNOSIS — H353131 Nonexudative age-related macular degeneration, bilateral, early dry stage: Secondary | ICD-10-CM | POA: Diagnosis not present

## 2020-06-08 DIAGNOSIS — H35033 Hypertensive retinopathy, bilateral: Secondary | ICD-10-CM | POA: Diagnosis not present

## 2020-06-08 DIAGNOSIS — H18413 Arcus senilis, bilateral: Secondary | ICD-10-CM | POA: Diagnosis not present

## 2020-06-23 DIAGNOSIS — F259 Schizoaffective disorder, unspecified: Secondary | ICD-10-CM | POA: Diagnosis not present

## 2020-06-23 DIAGNOSIS — G40909 Epilepsy, unspecified, not intractable, without status epilepticus: Secondary | ICD-10-CM | POA: Diagnosis not present

## 2020-06-23 DIAGNOSIS — R69 Illness, unspecified: Secondary | ICD-10-CM | POA: Diagnosis not present

## 2020-06-24 DIAGNOSIS — R69 Illness, unspecified: Secondary | ICD-10-CM | POA: Diagnosis not present

## 2020-06-24 DIAGNOSIS — G40909 Epilepsy, unspecified, not intractable, without status epilepticus: Secondary | ICD-10-CM | POA: Diagnosis not present

## 2020-06-24 DIAGNOSIS — F259 Schizoaffective disorder, unspecified: Secondary | ICD-10-CM | POA: Diagnosis not present

## 2020-06-25 DIAGNOSIS — F419 Anxiety disorder, unspecified: Secondary | ICD-10-CM | POA: Diagnosis not present

## 2020-06-25 DIAGNOSIS — R69 Illness, unspecified: Secondary | ICD-10-CM | POA: Diagnosis not present

## 2020-06-25 DIAGNOSIS — F259 Schizoaffective disorder, unspecified: Secondary | ICD-10-CM | POA: Diagnosis not present

## 2020-06-25 DIAGNOSIS — G40909 Epilepsy, unspecified, not intractable, without status epilepticus: Secondary | ICD-10-CM | POA: Diagnosis not present

## 2020-06-25 DIAGNOSIS — Z79899 Other long term (current) drug therapy: Secondary | ICD-10-CM | POA: Diagnosis not present

## 2020-06-26 DIAGNOSIS — G40909 Epilepsy, unspecified, not intractable, without status epilepticus: Secondary | ICD-10-CM | POA: Diagnosis not present

## 2020-06-26 DIAGNOSIS — F259 Schizoaffective disorder, unspecified: Secondary | ICD-10-CM | POA: Diagnosis not present

## 2020-06-26 DIAGNOSIS — Z79899 Other long term (current) drug therapy: Secondary | ICD-10-CM | POA: Diagnosis not present

## 2020-06-26 DIAGNOSIS — R69 Illness, unspecified: Secondary | ICD-10-CM | POA: Diagnosis not present

## 2020-06-27 DIAGNOSIS — R69 Illness, unspecified: Secondary | ICD-10-CM | POA: Diagnosis not present

## 2020-06-27 DIAGNOSIS — G40909 Epilepsy, unspecified, not intractable, without status epilepticus: Secondary | ICD-10-CM | POA: Diagnosis not present

## 2020-06-27 DIAGNOSIS — F259 Schizoaffective disorder, unspecified: Secondary | ICD-10-CM | POA: Diagnosis not present

## 2020-06-28 DIAGNOSIS — R69 Illness, unspecified: Secondary | ICD-10-CM | POA: Diagnosis not present

## 2020-06-28 DIAGNOSIS — F259 Schizoaffective disorder, unspecified: Secondary | ICD-10-CM | POA: Diagnosis not present

## 2020-06-28 DIAGNOSIS — G40909 Epilepsy, unspecified, not intractable, without status epilepticus: Secondary | ICD-10-CM | POA: Diagnosis not present

## 2020-06-29 DIAGNOSIS — G40909 Epilepsy, unspecified, not intractable, without status epilepticus: Secondary | ICD-10-CM | POA: Diagnosis not present

## 2020-06-29 DIAGNOSIS — F259 Schizoaffective disorder, unspecified: Secondary | ICD-10-CM | POA: Diagnosis not present

## 2020-06-29 DIAGNOSIS — R69 Illness, unspecified: Secondary | ICD-10-CM | POA: Diagnosis not present

## 2020-06-30 DIAGNOSIS — F259 Schizoaffective disorder, unspecified: Secondary | ICD-10-CM | POA: Diagnosis not present

## 2020-06-30 DIAGNOSIS — R69 Illness, unspecified: Secondary | ICD-10-CM | POA: Diagnosis not present

## 2020-06-30 DIAGNOSIS — G40909 Epilepsy, unspecified, not intractable, without status epilepticus: Secondary | ICD-10-CM | POA: Diagnosis not present

## 2020-07-01 DIAGNOSIS — F259 Schizoaffective disorder, unspecified: Secondary | ICD-10-CM | POA: Diagnosis not present

## 2020-07-01 DIAGNOSIS — G40909 Epilepsy, unspecified, not intractable, without status epilepticus: Secondary | ICD-10-CM | POA: Diagnosis not present

## 2020-07-01 DIAGNOSIS — R69 Illness, unspecified: Secondary | ICD-10-CM | POA: Diagnosis not present

## 2020-07-02 DIAGNOSIS — F259 Schizoaffective disorder, unspecified: Secondary | ICD-10-CM | POA: Diagnosis not present

## 2020-07-02 DIAGNOSIS — G40909 Epilepsy, unspecified, not intractable, without status epilepticus: Secondary | ICD-10-CM | POA: Diagnosis not present

## 2020-07-02 DIAGNOSIS — R69 Illness, unspecified: Secondary | ICD-10-CM | POA: Diagnosis not present

## 2020-07-03 DIAGNOSIS — Z9181 History of falling: Secondary | ICD-10-CM | POA: Diagnosis not present

## 2020-07-03 DIAGNOSIS — G40909 Epilepsy, unspecified, not intractable, without status epilepticus: Secondary | ICD-10-CM | POA: Diagnosis not present

## 2020-07-03 DIAGNOSIS — E559 Vitamin D deficiency, unspecified: Secondary | ICD-10-CM | POA: Diagnosis not present

## 2020-07-03 DIAGNOSIS — G4700 Insomnia, unspecified: Secondary | ICD-10-CM | POA: Diagnosis not present

## 2020-07-03 DIAGNOSIS — F259 Schizoaffective disorder, unspecified: Secondary | ICD-10-CM | POA: Diagnosis not present

## 2020-07-03 DIAGNOSIS — Z720 Tobacco use: Secondary | ICD-10-CM | POA: Diagnosis not present

## 2020-07-03 DIAGNOSIS — R69 Illness, unspecified: Secondary | ICD-10-CM | POA: Diagnosis not present

## 2020-07-04 DIAGNOSIS — F259 Schizoaffective disorder, unspecified: Secondary | ICD-10-CM | POA: Diagnosis not present

## 2020-07-04 DIAGNOSIS — R69 Illness, unspecified: Secondary | ICD-10-CM | POA: Diagnosis not present

## 2020-07-04 DIAGNOSIS — G40909 Epilepsy, unspecified, not intractable, without status epilepticus: Secondary | ICD-10-CM | POA: Diagnosis not present

## 2020-07-05 DIAGNOSIS — F259 Schizoaffective disorder, unspecified: Secondary | ICD-10-CM | POA: Diagnosis not present

## 2020-07-05 DIAGNOSIS — G40909 Epilepsy, unspecified, not intractable, without status epilepticus: Secondary | ICD-10-CM | POA: Diagnosis not present

## 2020-07-05 DIAGNOSIS — R69 Illness, unspecified: Secondary | ICD-10-CM | POA: Diagnosis not present

## 2020-07-06 DIAGNOSIS — F259 Schizoaffective disorder, unspecified: Secondary | ICD-10-CM | POA: Diagnosis not present

## 2020-07-06 DIAGNOSIS — R69 Illness, unspecified: Secondary | ICD-10-CM | POA: Diagnosis not present

## 2020-07-06 DIAGNOSIS — G40909 Epilepsy, unspecified, not intractable, without status epilepticus: Secondary | ICD-10-CM | POA: Diagnosis not present

## 2020-07-07 DIAGNOSIS — F259 Schizoaffective disorder, unspecified: Secondary | ICD-10-CM | POA: Diagnosis not present

## 2020-07-07 DIAGNOSIS — G40909 Epilepsy, unspecified, not intractable, without status epilepticus: Secondary | ICD-10-CM | POA: Diagnosis not present

## 2020-07-07 DIAGNOSIS — R69 Illness, unspecified: Secondary | ICD-10-CM | POA: Diagnosis not present

## 2020-07-08 DIAGNOSIS — G40909 Epilepsy, unspecified, not intractable, without status epilepticus: Secondary | ICD-10-CM | POA: Diagnosis not present

## 2020-07-08 DIAGNOSIS — F259 Schizoaffective disorder, unspecified: Secondary | ICD-10-CM | POA: Diagnosis not present

## 2020-07-08 DIAGNOSIS — R69 Illness, unspecified: Secondary | ICD-10-CM | POA: Diagnosis not present

## 2020-07-09 DIAGNOSIS — R509 Fever, unspecified: Secondary | ICD-10-CM | POA: Diagnosis not present

## 2020-07-09 DIAGNOSIS — T887XXA Unspecified adverse effect of drug or medicament, initial encounter: Secondary | ICD-10-CM | POA: Diagnosis not present

## 2020-07-09 DIAGNOSIS — Z711 Person with feared health complaint in whom no diagnosis is made: Secondary | ICD-10-CM | POA: Diagnosis not present

## 2020-07-09 DIAGNOSIS — T50901A Poisoning by unspecified drugs, medicaments and biological substances, accidental (unintentional), initial encounter: Secondary | ICD-10-CM | POA: Diagnosis not present

## 2020-07-09 DIAGNOSIS — R69 Illness, unspecified: Secondary | ICD-10-CM | POA: Diagnosis not present

## 2020-07-09 DIAGNOSIS — Z743 Need for continuous supervision: Secondary | ICD-10-CM | POA: Diagnosis not present

## 2020-07-09 DIAGNOSIS — G40909 Epilepsy, unspecified, not intractable, without status epilepticus: Secondary | ICD-10-CM | POA: Diagnosis not present

## 2020-07-09 DIAGNOSIS — F259 Schizoaffective disorder, unspecified: Secondary | ICD-10-CM | POA: Diagnosis not present

## 2020-07-09 DIAGNOSIS — R7989 Other specified abnormal findings of blood chemistry: Secondary | ICD-10-CM | POA: Diagnosis not present

## 2020-07-10 DIAGNOSIS — F259 Schizoaffective disorder, unspecified: Secondary | ICD-10-CM | POA: Diagnosis not present

## 2020-07-10 DIAGNOSIS — G40909 Epilepsy, unspecified, not intractable, without status epilepticus: Secondary | ICD-10-CM | POA: Diagnosis not present

## 2020-07-10 DIAGNOSIS — R69 Illness, unspecified: Secondary | ICD-10-CM | POA: Diagnosis not present

## 2020-07-11 DIAGNOSIS — R69 Illness, unspecified: Secondary | ICD-10-CM | POA: Diagnosis not present

## 2020-07-11 DIAGNOSIS — F259 Schizoaffective disorder, unspecified: Secondary | ICD-10-CM | POA: Diagnosis not present

## 2020-07-11 DIAGNOSIS — G40909 Epilepsy, unspecified, not intractable, without status epilepticus: Secondary | ICD-10-CM | POA: Diagnosis not present

## 2020-07-12 DIAGNOSIS — G40909 Epilepsy, unspecified, not intractable, without status epilepticus: Secondary | ICD-10-CM | POA: Diagnosis not present

## 2020-07-12 DIAGNOSIS — R69 Illness, unspecified: Secondary | ICD-10-CM | POA: Diagnosis not present

## 2020-07-12 DIAGNOSIS — F259 Schizoaffective disorder, unspecified: Secondary | ICD-10-CM | POA: Diagnosis not present

## 2020-07-13 DIAGNOSIS — R69 Illness, unspecified: Secondary | ICD-10-CM | POA: Diagnosis not present

## 2020-07-13 DIAGNOSIS — F259 Schizoaffective disorder, unspecified: Secondary | ICD-10-CM | POA: Diagnosis not present

## 2020-07-13 DIAGNOSIS — G40909 Epilepsy, unspecified, not intractable, without status epilepticus: Secondary | ICD-10-CM | POA: Diagnosis not present

## 2020-07-14 DIAGNOSIS — G40909 Epilepsy, unspecified, not intractable, without status epilepticus: Secondary | ICD-10-CM | POA: Diagnosis not present

## 2020-07-14 DIAGNOSIS — F259 Schizoaffective disorder, unspecified: Secondary | ICD-10-CM | POA: Diagnosis not present

## 2020-07-14 DIAGNOSIS — R69 Illness, unspecified: Secondary | ICD-10-CM | POA: Diagnosis not present

## 2020-07-15 DIAGNOSIS — R69 Illness, unspecified: Secondary | ICD-10-CM | POA: Diagnosis not present

## 2020-07-15 DIAGNOSIS — G40909 Epilepsy, unspecified, not intractable, without status epilepticus: Secondary | ICD-10-CM | POA: Diagnosis not present

## 2020-07-15 DIAGNOSIS — F259 Schizoaffective disorder, unspecified: Secondary | ICD-10-CM | POA: Diagnosis not present

## 2020-07-16 DIAGNOSIS — F259 Schizoaffective disorder, unspecified: Secondary | ICD-10-CM | POA: Diagnosis not present

## 2020-07-16 DIAGNOSIS — G40909 Epilepsy, unspecified, not intractable, without status epilepticus: Secondary | ICD-10-CM | POA: Diagnosis not present

## 2020-07-16 DIAGNOSIS — R69 Illness, unspecified: Secondary | ICD-10-CM | POA: Diagnosis not present

## 2020-07-17 DIAGNOSIS — G40909 Epilepsy, unspecified, not intractable, without status epilepticus: Secondary | ICD-10-CM | POA: Diagnosis not present

## 2020-07-17 DIAGNOSIS — R69 Illness, unspecified: Secondary | ICD-10-CM | POA: Diagnosis not present

## 2020-07-17 DIAGNOSIS — F259 Schizoaffective disorder, unspecified: Secondary | ICD-10-CM | POA: Diagnosis not present

## 2020-07-18 DIAGNOSIS — F259 Schizoaffective disorder, unspecified: Secondary | ICD-10-CM | POA: Diagnosis not present

## 2020-07-18 DIAGNOSIS — R69 Illness, unspecified: Secondary | ICD-10-CM | POA: Diagnosis not present

## 2020-07-18 DIAGNOSIS — G40909 Epilepsy, unspecified, not intractable, without status epilepticus: Secondary | ICD-10-CM | POA: Diagnosis not present

## 2020-07-19 DIAGNOSIS — F259 Schizoaffective disorder, unspecified: Secondary | ICD-10-CM | POA: Diagnosis not present

## 2020-07-19 DIAGNOSIS — R69 Illness, unspecified: Secondary | ICD-10-CM | POA: Diagnosis not present

## 2020-07-19 DIAGNOSIS — G40909 Epilepsy, unspecified, not intractable, without status epilepticus: Secondary | ICD-10-CM | POA: Diagnosis not present

## 2020-07-20 DIAGNOSIS — R69 Illness, unspecified: Secondary | ICD-10-CM | POA: Diagnosis not present

## 2020-07-20 DIAGNOSIS — G40909 Epilepsy, unspecified, not intractable, without status epilepticus: Secondary | ICD-10-CM | POA: Diagnosis not present

## 2020-07-20 DIAGNOSIS — F259 Schizoaffective disorder, unspecified: Secondary | ICD-10-CM | POA: Diagnosis not present

## 2020-07-21 DIAGNOSIS — F259 Schizoaffective disorder, unspecified: Secondary | ICD-10-CM | POA: Diagnosis not present

## 2020-07-21 DIAGNOSIS — G40909 Epilepsy, unspecified, not intractable, without status epilepticus: Secondary | ICD-10-CM | POA: Diagnosis not present

## 2020-07-21 DIAGNOSIS — R69 Illness, unspecified: Secondary | ICD-10-CM | POA: Diagnosis not present

## 2020-07-22 DIAGNOSIS — R69 Illness, unspecified: Secondary | ICD-10-CM | POA: Diagnosis not present

## 2020-07-22 DIAGNOSIS — G40909 Epilepsy, unspecified, not intractable, without status epilepticus: Secondary | ICD-10-CM | POA: Diagnosis not present

## 2020-07-22 DIAGNOSIS — F259 Schizoaffective disorder, unspecified: Secondary | ICD-10-CM | POA: Diagnosis not present

## 2020-07-23 DIAGNOSIS — Z79899 Other long term (current) drug therapy: Secondary | ICD-10-CM | POA: Diagnosis not present

## 2020-07-23 DIAGNOSIS — R69 Illness, unspecified: Secondary | ICD-10-CM | POA: Diagnosis not present

## 2020-07-23 DIAGNOSIS — F259 Schizoaffective disorder, unspecified: Secondary | ICD-10-CM | POA: Diagnosis not present

## 2020-07-23 DIAGNOSIS — F419 Anxiety disorder, unspecified: Secondary | ICD-10-CM | POA: Diagnosis not present

## 2020-07-23 DIAGNOSIS — G40909 Epilepsy, unspecified, not intractable, without status epilepticus: Secondary | ICD-10-CM | POA: Diagnosis not present

## 2020-07-24 DIAGNOSIS — Z79899 Other long term (current) drug therapy: Secondary | ICD-10-CM | POA: Diagnosis not present

## 2020-07-24 DIAGNOSIS — F259 Schizoaffective disorder, unspecified: Secondary | ICD-10-CM | POA: Diagnosis not present

## 2020-07-24 DIAGNOSIS — G40909 Epilepsy, unspecified, not intractable, without status epilepticus: Secondary | ICD-10-CM | POA: Diagnosis not present

## 2020-07-24 DIAGNOSIS — R69 Illness, unspecified: Secondary | ICD-10-CM | POA: Diagnosis not present

## 2020-07-25 DIAGNOSIS — R69 Illness, unspecified: Secondary | ICD-10-CM | POA: Diagnosis not present

## 2020-07-25 DIAGNOSIS — G40909 Epilepsy, unspecified, not intractable, without status epilepticus: Secondary | ICD-10-CM | POA: Diagnosis not present

## 2020-07-25 DIAGNOSIS — F259 Schizoaffective disorder, unspecified: Secondary | ICD-10-CM | POA: Diagnosis not present

## 2020-07-26 DIAGNOSIS — F259 Schizoaffective disorder, unspecified: Secondary | ICD-10-CM | POA: Diagnosis not present

## 2020-07-26 DIAGNOSIS — R69 Illness, unspecified: Secondary | ICD-10-CM | POA: Diagnosis not present

## 2020-07-26 DIAGNOSIS — G40909 Epilepsy, unspecified, not intractable, without status epilepticus: Secondary | ICD-10-CM | POA: Diagnosis not present

## 2020-07-27 DIAGNOSIS — R69 Illness, unspecified: Secondary | ICD-10-CM | POA: Diagnosis not present

## 2020-07-27 DIAGNOSIS — F259 Schizoaffective disorder, unspecified: Secondary | ICD-10-CM | POA: Diagnosis not present

## 2020-07-27 DIAGNOSIS — G40909 Epilepsy, unspecified, not intractable, without status epilepticus: Secondary | ICD-10-CM | POA: Diagnosis not present

## 2020-07-28 DIAGNOSIS — G40909 Epilepsy, unspecified, not intractable, without status epilepticus: Secondary | ICD-10-CM | POA: Diagnosis not present

## 2020-07-28 DIAGNOSIS — F259 Schizoaffective disorder, unspecified: Secondary | ICD-10-CM | POA: Diagnosis not present

## 2020-07-28 DIAGNOSIS — R69 Illness, unspecified: Secondary | ICD-10-CM | POA: Diagnosis not present

## 2020-07-29 DIAGNOSIS — F259 Schizoaffective disorder, unspecified: Secondary | ICD-10-CM | POA: Diagnosis not present

## 2020-07-29 DIAGNOSIS — G40909 Epilepsy, unspecified, not intractable, without status epilepticus: Secondary | ICD-10-CM | POA: Diagnosis not present

## 2020-07-29 DIAGNOSIS — R69 Illness, unspecified: Secondary | ICD-10-CM | POA: Diagnosis not present

## 2020-07-30 DIAGNOSIS — F259 Schizoaffective disorder, unspecified: Secondary | ICD-10-CM | POA: Diagnosis not present

## 2020-07-30 DIAGNOSIS — R69 Illness, unspecified: Secondary | ICD-10-CM | POA: Diagnosis not present

## 2020-07-30 DIAGNOSIS — G40909 Epilepsy, unspecified, not intractable, without status epilepticus: Secondary | ICD-10-CM | POA: Diagnosis not present

## 2020-07-31 DIAGNOSIS — Z720 Tobacco use: Secondary | ICD-10-CM | POA: Diagnosis not present

## 2020-07-31 DIAGNOSIS — Z9181 History of falling: Secondary | ICD-10-CM | POA: Diagnosis not present

## 2020-07-31 DIAGNOSIS — G4700 Insomnia, unspecified: Secondary | ICD-10-CM | POA: Diagnosis not present

## 2020-07-31 DIAGNOSIS — F259 Schizoaffective disorder, unspecified: Secondary | ICD-10-CM | POA: Diagnosis not present

## 2020-07-31 DIAGNOSIS — G40909 Epilepsy, unspecified, not intractable, without status epilepticus: Secondary | ICD-10-CM | POA: Diagnosis not present

## 2020-07-31 DIAGNOSIS — E559 Vitamin D deficiency, unspecified: Secondary | ICD-10-CM | POA: Diagnosis not present

## 2020-07-31 DIAGNOSIS — R69 Illness, unspecified: Secondary | ICD-10-CM | POA: Diagnosis not present

## 2020-08-01 DIAGNOSIS — F259 Schizoaffective disorder, unspecified: Secondary | ICD-10-CM | POA: Diagnosis not present

## 2020-08-01 DIAGNOSIS — R69 Illness, unspecified: Secondary | ICD-10-CM | POA: Diagnosis not present

## 2020-08-01 DIAGNOSIS — G40909 Epilepsy, unspecified, not intractable, without status epilepticus: Secondary | ICD-10-CM | POA: Diagnosis not present

## 2020-08-02 DIAGNOSIS — F259 Schizoaffective disorder, unspecified: Secondary | ICD-10-CM | POA: Diagnosis not present

## 2020-08-02 DIAGNOSIS — R69 Illness, unspecified: Secondary | ICD-10-CM | POA: Diagnosis not present

## 2020-08-02 DIAGNOSIS — G40909 Epilepsy, unspecified, not intractable, without status epilepticus: Secondary | ICD-10-CM | POA: Diagnosis not present

## 2020-08-03 DIAGNOSIS — F259 Schizoaffective disorder, unspecified: Secondary | ICD-10-CM | POA: Diagnosis not present

## 2020-08-03 DIAGNOSIS — G40909 Epilepsy, unspecified, not intractable, without status epilepticus: Secondary | ICD-10-CM | POA: Diagnosis not present

## 2020-08-03 DIAGNOSIS — R69 Illness, unspecified: Secondary | ICD-10-CM | POA: Diagnosis not present

## 2020-08-04 DIAGNOSIS — R69 Illness, unspecified: Secondary | ICD-10-CM | POA: Diagnosis not present

## 2020-08-04 DIAGNOSIS — F259 Schizoaffective disorder, unspecified: Secondary | ICD-10-CM | POA: Diagnosis not present

## 2020-08-04 DIAGNOSIS — G40909 Epilepsy, unspecified, not intractable, without status epilepticus: Secondary | ICD-10-CM | POA: Diagnosis not present

## 2020-08-05 DIAGNOSIS — F259 Schizoaffective disorder, unspecified: Secondary | ICD-10-CM | POA: Diagnosis not present

## 2020-08-05 DIAGNOSIS — R69 Illness, unspecified: Secondary | ICD-10-CM | POA: Diagnosis not present

## 2020-08-05 DIAGNOSIS — G40909 Epilepsy, unspecified, not intractable, without status epilepticus: Secondary | ICD-10-CM | POA: Diagnosis not present

## 2020-08-06 DIAGNOSIS — G40909 Epilepsy, unspecified, not intractable, without status epilepticus: Secondary | ICD-10-CM | POA: Diagnosis not present

## 2020-08-06 DIAGNOSIS — R69 Illness, unspecified: Secondary | ICD-10-CM | POA: Diagnosis not present

## 2020-08-06 DIAGNOSIS — F259 Schizoaffective disorder, unspecified: Secondary | ICD-10-CM | POA: Diagnosis not present

## 2020-08-07 DIAGNOSIS — G40909 Epilepsy, unspecified, not intractable, without status epilepticus: Secondary | ICD-10-CM | POA: Diagnosis not present

## 2020-08-07 DIAGNOSIS — R69 Illness, unspecified: Secondary | ICD-10-CM | POA: Diagnosis not present

## 2020-08-07 DIAGNOSIS — F259 Schizoaffective disorder, unspecified: Secondary | ICD-10-CM | POA: Diagnosis not present

## 2020-08-08 DIAGNOSIS — F259 Schizoaffective disorder, unspecified: Secondary | ICD-10-CM | POA: Diagnosis not present

## 2020-08-08 DIAGNOSIS — G40909 Epilepsy, unspecified, not intractable, without status epilepticus: Secondary | ICD-10-CM | POA: Diagnosis not present

## 2020-08-08 DIAGNOSIS — R69 Illness, unspecified: Secondary | ICD-10-CM | POA: Diagnosis not present

## 2020-08-09 DIAGNOSIS — R69 Illness, unspecified: Secondary | ICD-10-CM | POA: Diagnosis not present

## 2020-08-09 DIAGNOSIS — F259 Schizoaffective disorder, unspecified: Secondary | ICD-10-CM | POA: Diagnosis not present

## 2020-08-09 DIAGNOSIS — G40909 Epilepsy, unspecified, not intractable, without status epilepticus: Secondary | ICD-10-CM | POA: Diagnosis not present

## 2020-08-10 DIAGNOSIS — R69 Illness, unspecified: Secondary | ICD-10-CM | POA: Diagnosis not present

## 2020-08-10 DIAGNOSIS — F259 Schizoaffective disorder, unspecified: Secondary | ICD-10-CM | POA: Diagnosis not present

## 2020-08-10 DIAGNOSIS — G40909 Epilepsy, unspecified, not intractable, without status epilepticus: Secondary | ICD-10-CM | POA: Diagnosis not present

## 2020-08-11 DIAGNOSIS — F259 Schizoaffective disorder, unspecified: Secondary | ICD-10-CM | POA: Diagnosis not present

## 2020-08-11 DIAGNOSIS — G40909 Epilepsy, unspecified, not intractable, without status epilepticus: Secondary | ICD-10-CM | POA: Diagnosis not present

## 2020-08-11 DIAGNOSIS — R69 Illness, unspecified: Secondary | ICD-10-CM | POA: Diagnosis not present

## 2020-08-12 DIAGNOSIS — R69 Illness, unspecified: Secondary | ICD-10-CM | POA: Diagnosis not present

## 2020-08-12 DIAGNOSIS — F259 Schizoaffective disorder, unspecified: Secondary | ICD-10-CM | POA: Diagnosis not present

## 2020-08-12 DIAGNOSIS — G40909 Epilepsy, unspecified, not intractable, without status epilepticus: Secondary | ICD-10-CM | POA: Diagnosis not present

## 2020-08-13 DIAGNOSIS — G40909 Epilepsy, unspecified, not intractable, without status epilepticus: Secondary | ICD-10-CM | POA: Diagnosis not present

## 2020-08-13 DIAGNOSIS — R69 Illness, unspecified: Secondary | ICD-10-CM | POA: Diagnosis not present

## 2020-08-13 DIAGNOSIS — F259 Schizoaffective disorder, unspecified: Secondary | ICD-10-CM | POA: Diagnosis not present

## 2021-03-17 IMAGING — CT CT ABD-PELV W/ CM
2 of 5 series · 16 of 46 positions shown, 18 images · IV contrast (omnipaque)
Comparison: None

CLINICAL DATA: Nausea, intractable vomiting, found by a bystander
after coming [place] of the woods, missing since [REDACTED]

EXAM:
CT ABDOMEN AND PELVIS WITH CONTRAST
TECHNIQUE: Multidetector CT imaging of the abdomen and pelvis was performed
using the standard protocol following bolus administration of
intravenous contrast. Sagittal and coronal MPR images reconstructed
from axial data set.
CONTRAST:  100mL OMNIPAQUE IOHEXOL 300 MG/ML SOLN IV. No oral
contrast.

[Series 2: axial st · axial · 0.66mm/px · z∈[-431,-101]mm · 13 of 78 slices shown, 15 images]
[im 6/78  soft-tissue]
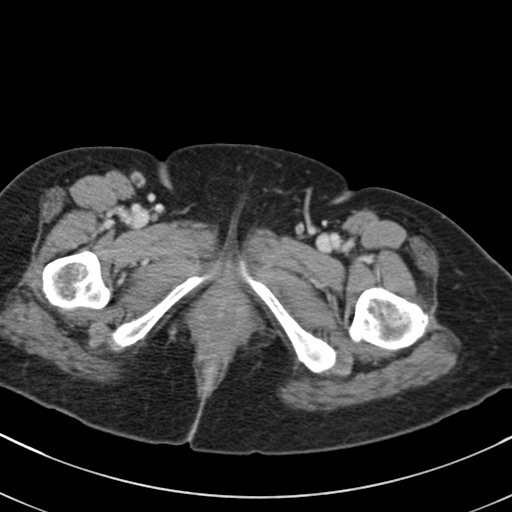
[im 6/78  bone]
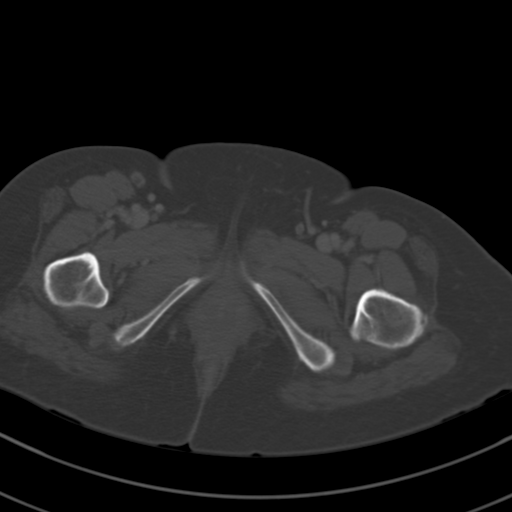
[im 11/78  soft-tissue]
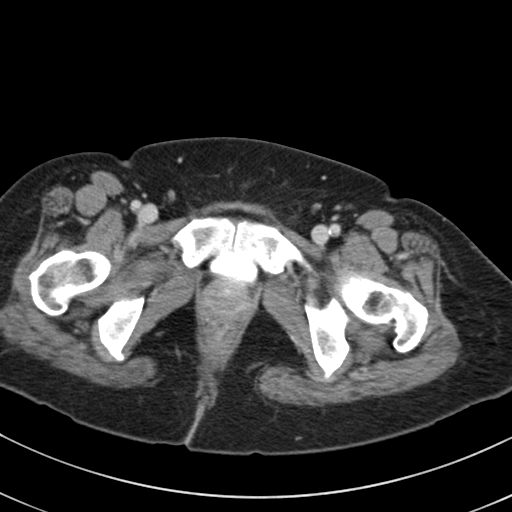
[im 16/78  soft-tissue]
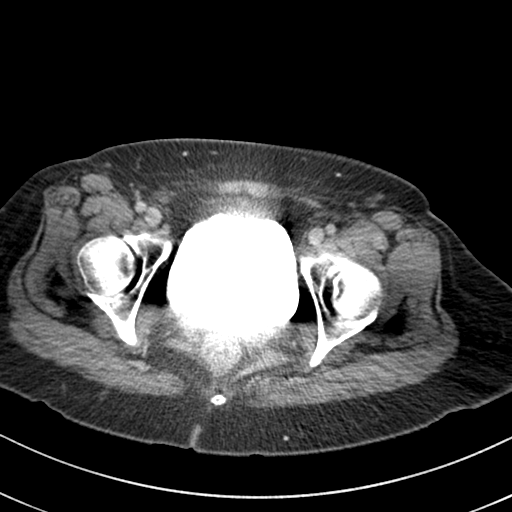
[im 21/78  soft-tissue]
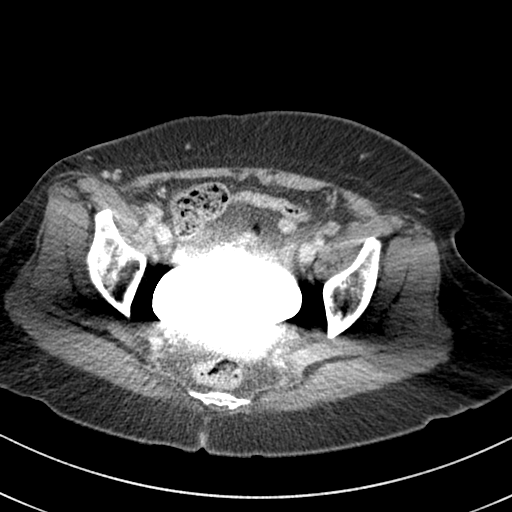
[im 26/78  soft-tissue]
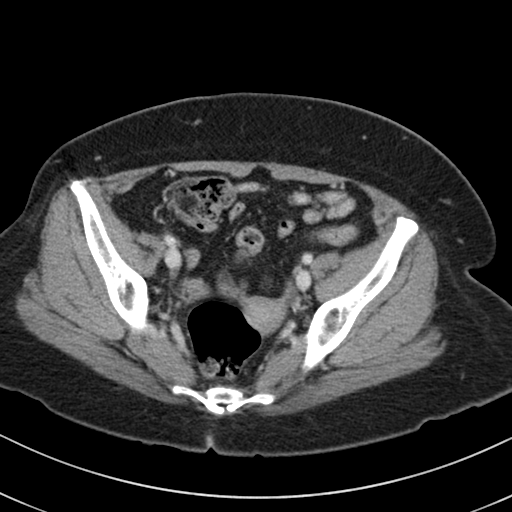
[im 31/78  soft-tissue]
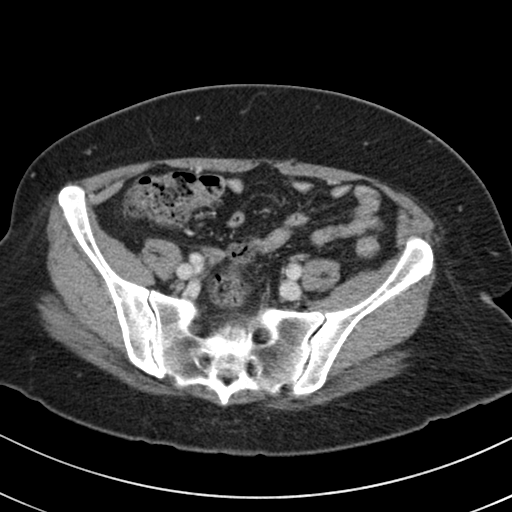
[im 42/78  soft-tissue]
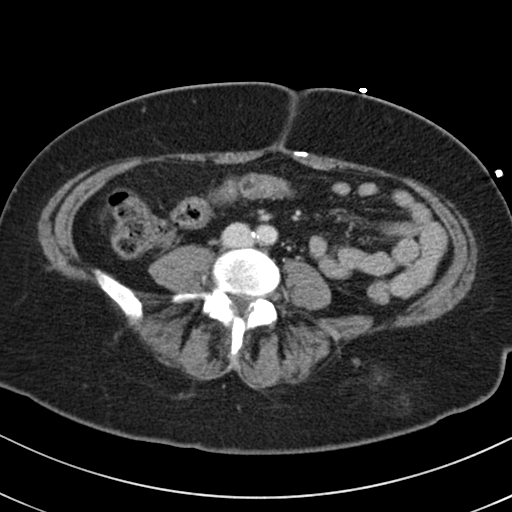
[im 47/78  soft-tissue]
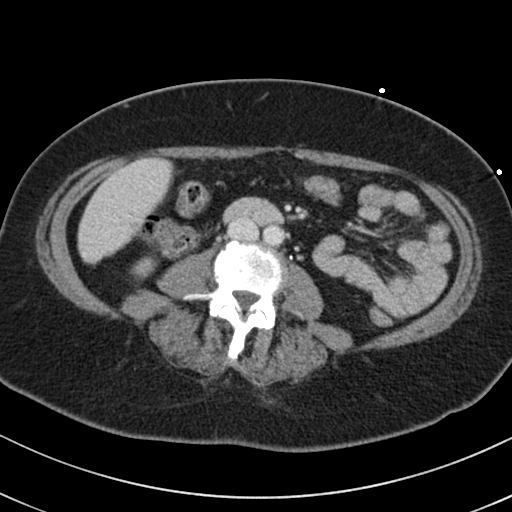
[im 52/78  soft-tissue]
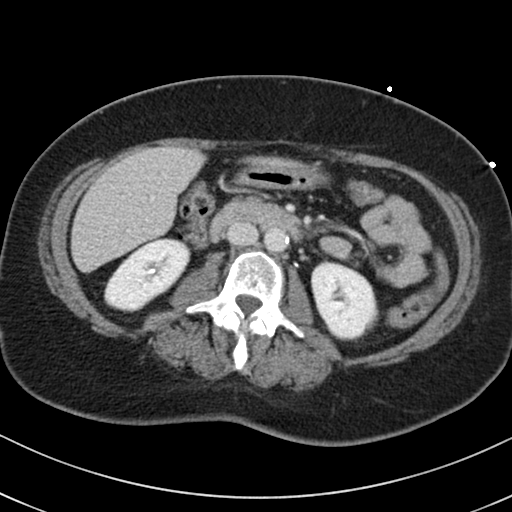
[im 52/78  bone]
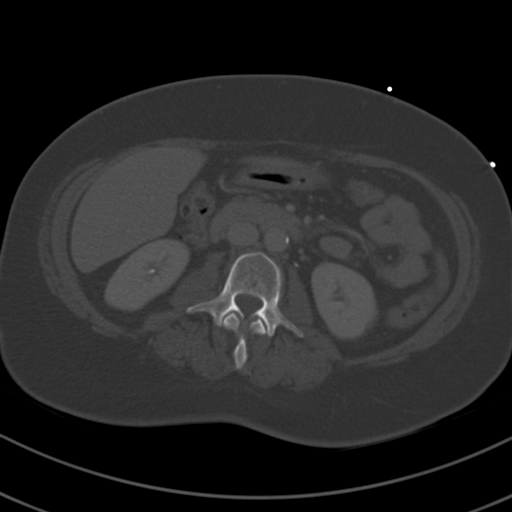
[im 57/78  soft-tissue]
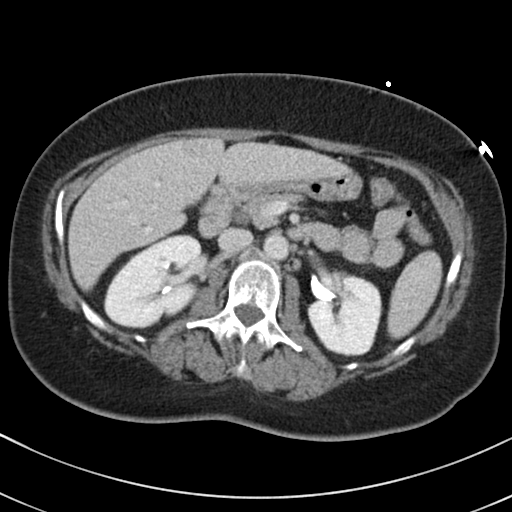
[im 62/78  soft-tissue]
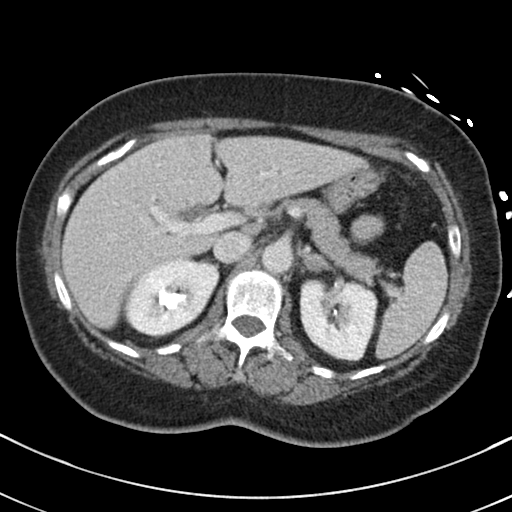
[im 67/78  soft-tissue]
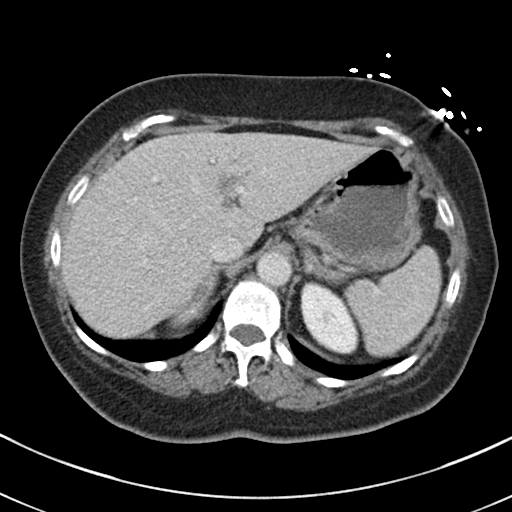
[im 72/78  soft-tissue]
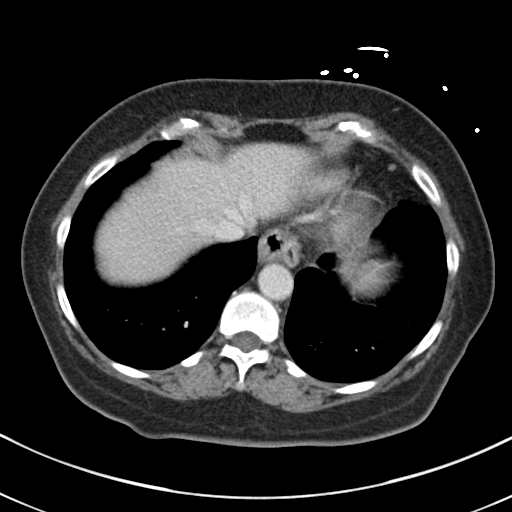

[Series 5: coronal st · coronal · 0.70mm/px · 3 of 131 slices shown]
[im 44/131  soft-tissue]
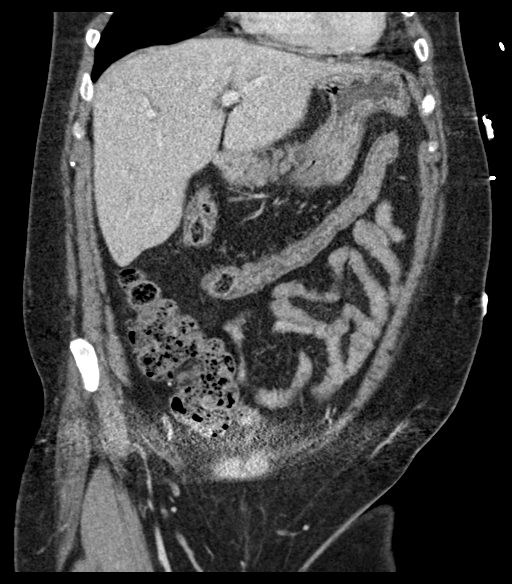
[im 58/131  soft-tissue]
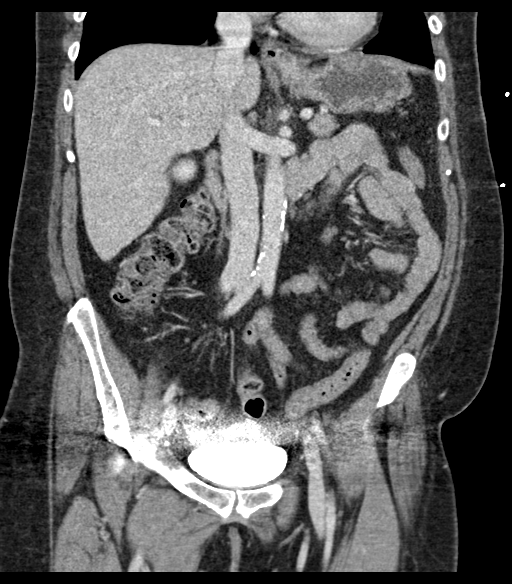
[im 73/131  soft-tissue]
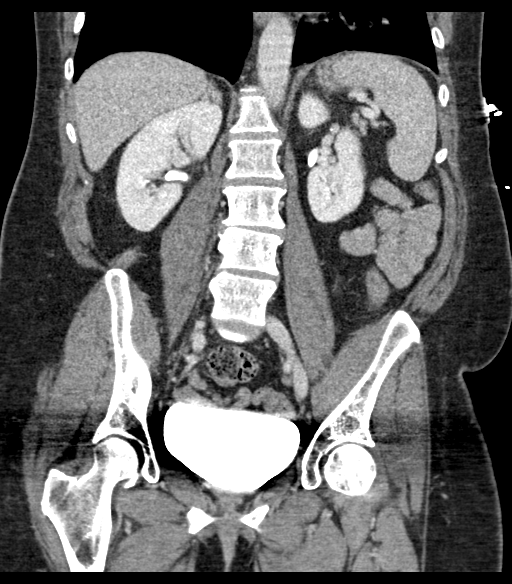

[16 of 46 positions shown; findings below may reference images not displayed]

FINDINGS: Lower chest: Lung bases clear

Hepatobiliary: Focal fatty infiltration of liver adjacent to
falciform fissure. Gallbladder surgically absent. Liver otherwise
unremarkable.

Pancreas: Normal appearance

Spleen: Normal appearance

Adrenals/Urinary Tract: 17 x 14 mm LEFT adrenal nodule demonstrating
significant washout on delayed images, consistent with adrenal
adenoma. RIGHT adrenal gland unremarkable. Kidneys and ureters
normal appearance. Bladder filled by dense contrast from earlier CTA
neck exam, suboptimally assessed, no gross abnormality seen

Stomach/Bowel: Probable appendix in RIGHT pelvis, unremarkable.
Small hiatal hernia. Stomach and bowel loops otherwise normal
appearance.

Vascular/Lymphatic: Aorta normal caliber. Scattered atherosclerotic
calcifications aorta and iliac arteries. No adenopathy.

Reproductive: Atrophic uterus, unremarkable ovaries

Other: No free air or free fluid. No hernia or inflammatory process.

Musculoskeletal: Small bone island L4 vertebral body. No other
osseous findings.
IMPRESSION: LEFT adrenal adenoma 17 x 14 mm.

Small hiatal hernia.

No acute intra-abdominal or intrapelvic abnormalities.

Aortic Atherosclerosis ([id number]-XED.D).

## 2022-03-06 IMAGING — DX DG HAND COMPLETE 3+V*R*
3 series · 3 of 3 positions shown · non-contrast
Comparison: None.

CLINICAL DATA: Scabs

EXAM:
RIGHT HAND - COMPLETE 3+ VIEW

[hand ap]
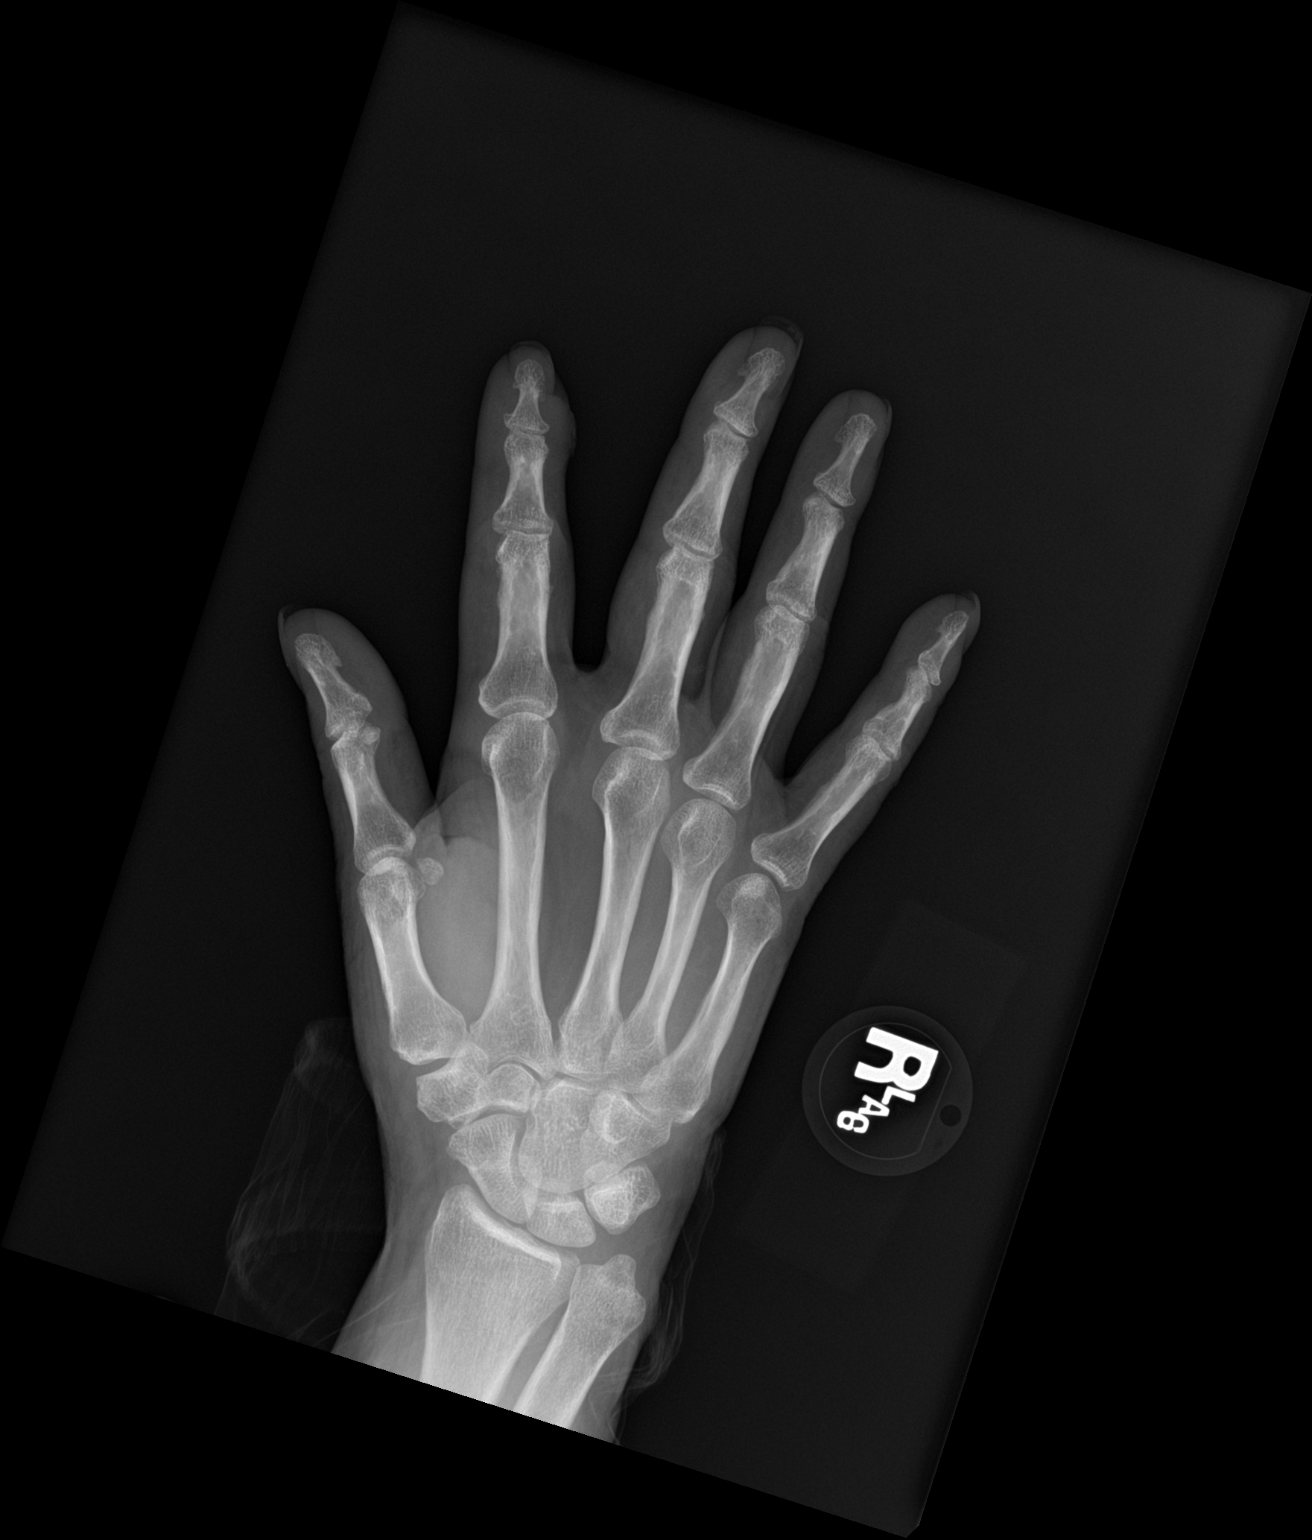

[hand obl]
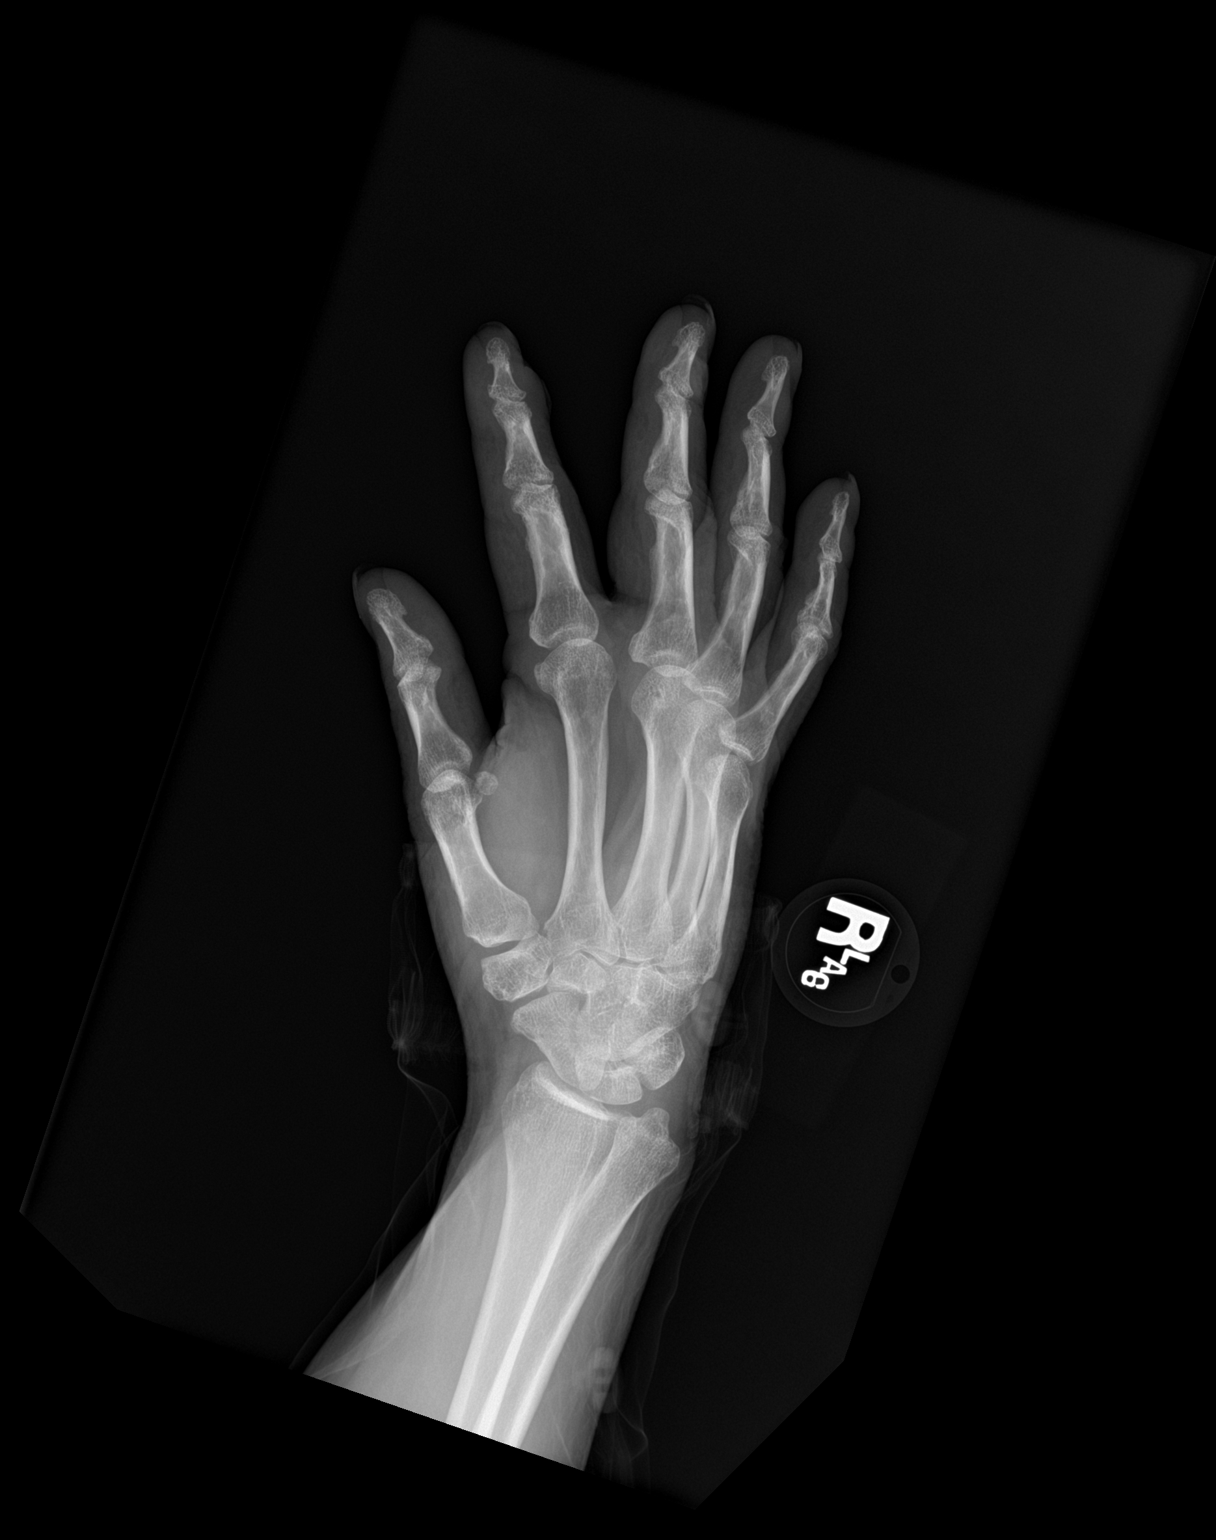

[hand lat]
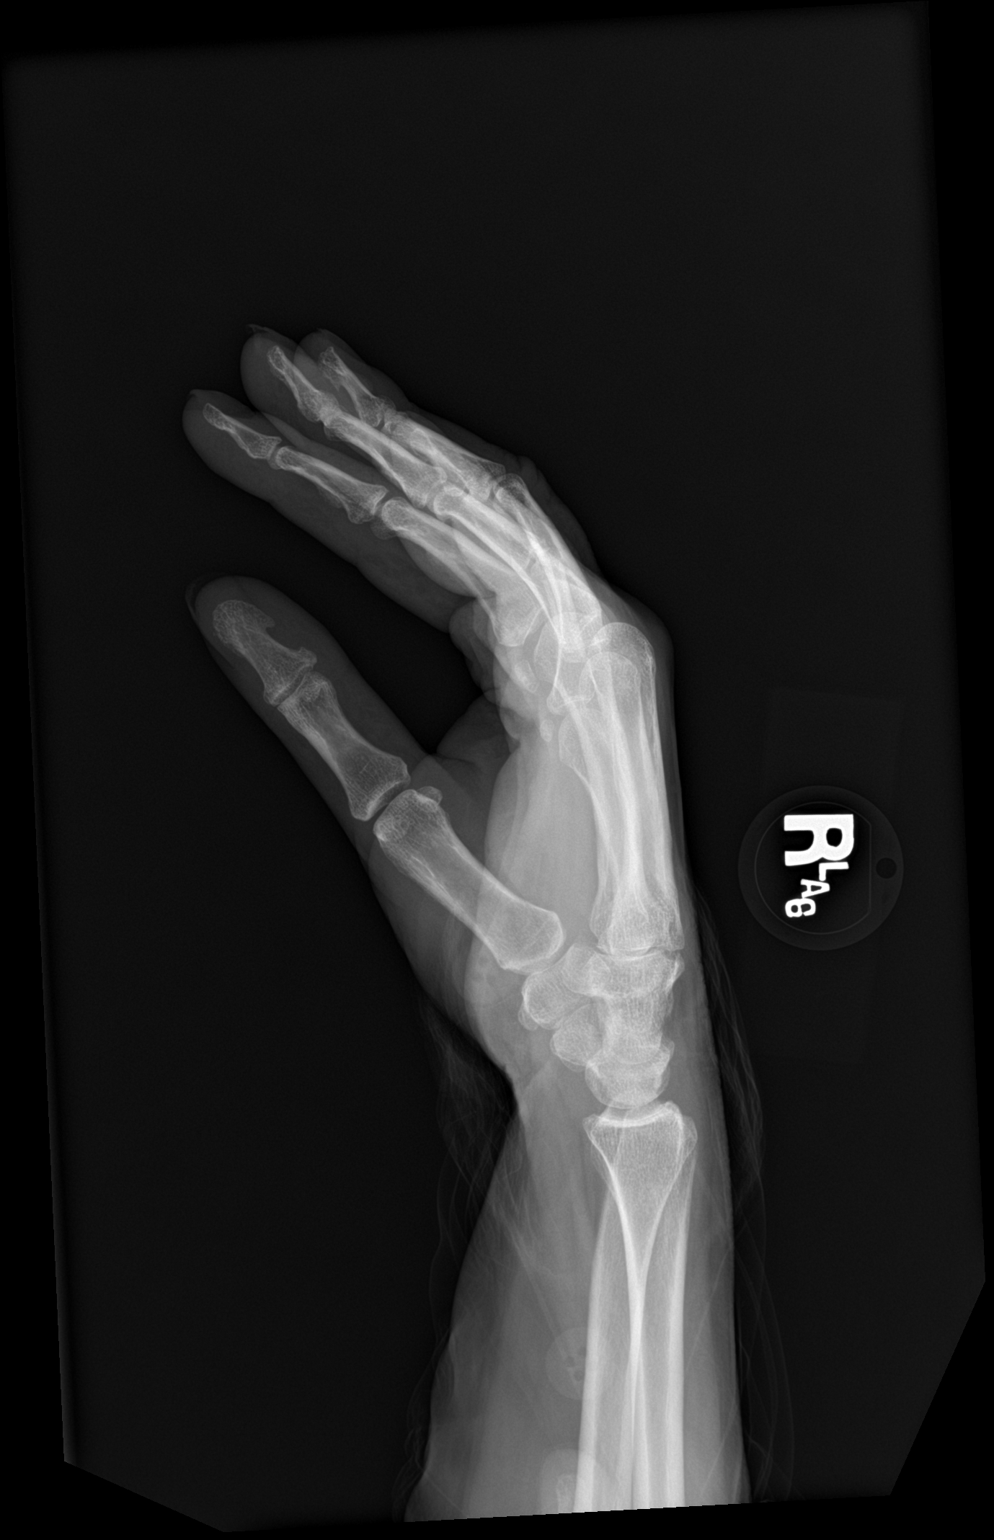

[3 of 3 positions shown; findings below may reference images not displayed]

FINDINGS: There is no evidence of fracture or dislocation. There is no
evidence of arthropathy or other focal bone abnormality. Soft
tissues are unremarkable.
IMPRESSION: Negative.
